# Patient Record
Sex: Male | Born: 1950
Health system: Southern US, Community
[De-identification: ages and names within clinical notes are randomized; demographics above are authoritative.]

## PROBLEM LIST (undated history)

## (undated) DIAGNOSIS — G4700 Insomnia, unspecified: Secondary | ICD-10-CM

## (undated) DIAGNOSIS — Z889 Allergy status to unspecified drugs, medicaments and biological substances status: Secondary | ICD-10-CM

## (undated) DIAGNOSIS — I452 Bifascicular block: Secondary | ICD-10-CM

## (undated) DIAGNOSIS — N529 Male erectile dysfunction, unspecified: Secondary | ICD-10-CM

## (undated) DIAGNOSIS — N400 Enlarged prostate without lower urinary tract symptoms: Secondary | ICD-10-CM

## (undated) HISTORY — PX: TONSILLECTOMY: SUR1361

## (undated) HISTORY — DX: Insomnia, unspecified: G47.00

## (undated) HISTORY — DX: Bifascicular block: I45.2

## (undated) HISTORY — DX: Benign prostatic hyperplasia without lower urinary tract symptoms: N40.0

## (undated) HISTORY — DX: Male erectile dysfunction, unspecified: N52.9

---

## 2001-11-24 ENCOUNTER — Encounter: Payer: Self-pay | Admitting: Urology

## 2001-11-24 ENCOUNTER — Ambulatory Visit (HOSPITAL_COMMUNITY): Admission: RE | Admit: 2001-11-24 | Discharge: 2001-11-24 | Payer: Self-pay | Admitting: Urology

## 2006-04-30 ENCOUNTER — Emergency Department (HOSPITAL_COMMUNITY): Admission: EM | Admit: 2006-04-30 | Discharge: 2006-04-30 | Payer: Self-pay | Admitting: Emergency Medicine

## 2006-05-01 ENCOUNTER — Emergency Department (HOSPITAL_COMMUNITY): Admission: EM | Admit: 2006-05-01 | Discharge: 2006-05-01 | Payer: Self-pay | Admitting: Emergency Medicine

## 2007-05-30 ENCOUNTER — Ambulatory Visit (HOSPITAL_COMMUNITY): Admission: RE | Admit: 2007-05-30 | Discharge: 2007-05-30 | Payer: Self-pay | Admitting: Gastroenterology

## 2009-07-28 ENCOUNTER — Encounter: Payer: Self-pay | Admitting: Cardiovascular Disease

## 2010-01-05 ENCOUNTER — Encounter: Payer: Self-pay | Admitting: Cardiovascular Disease

## 2010-01-07 ENCOUNTER — Ambulatory Visit (HOSPITAL_COMMUNITY): Admission: RE | Admit: 2010-01-07 | Discharge: 2010-01-07 | Payer: Self-pay | Admitting: Family Medicine

## 2010-01-07 ENCOUNTER — Encounter: Payer: Self-pay | Admitting: Cardiology

## 2010-01-07 ENCOUNTER — Ambulatory Visit: Payer: Self-pay

## 2010-01-07 ENCOUNTER — Encounter (INDEPENDENT_AMBULATORY_CARE_PROVIDER_SITE_OTHER): Payer: Self-pay | Admitting: Family Medicine

## 2010-01-07 ENCOUNTER — Ambulatory Visit: Payer: Self-pay | Admitting: Internal Medicine

## 2010-02-03 DIAGNOSIS — G4700 Insomnia, unspecified: Secondary | ICD-10-CM

## 2010-02-03 DIAGNOSIS — Z87448 Personal history of other diseases of urinary system: Secondary | ICD-10-CM

## 2010-02-03 DIAGNOSIS — I4510 Unspecified right bundle-branch block: Secondary | ICD-10-CM

## 2010-02-04 ENCOUNTER — Ambulatory Visit: Payer: Self-pay | Admitting: Cardiovascular Disease

## 2010-02-04 DIAGNOSIS — I428 Other cardiomyopathies: Secondary | ICD-10-CM | POA: Insufficient documentation

## 2010-02-04 DIAGNOSIS — R0602 Shortness of breath: Secondary | ICD-10-CM

## 2010-02-18 ENCOUNTER — Encounter (INDEPENDENT_AMBULATORY_CARE_PROVIDER_SITE_OTHER): Payer: Self-pay | Admitting: *Deleted

## 2010-02-23 ENCOUNTER — Ambulatory Visit (HOSPITAL_COMMUNITY)
Admission: RE | Admit: 2010-02-23 | Discharge: 2010-02-23 | Payer: Self-pay | Source: Home / Self Care | Attending: Cardiovascular Disease | Admitting: Cardiovascular Disease

## 2010-02-24 ENCOUNTER — Encounter: Payer: Self-pay | Admitting: Cardiovascular Disease

## 2010-04-07 NOTE — Assessment & Plan Note (Signed)
Summary: np3/abn echo   CC:  sob...follow up echo referal from dr. Paulino Rily.  History of Present Illness: Mariah is seen at the request of Dr Laurine Blazer.  He has a longstanding history of RBBB.  He indicates having a cath at age 60 or 14 for murmur with nothing bad found.  RBBB been intermitant since teenage years.  Had echo  01/07/10 which I reviewed the images with the patient and his wife who is a Teacher, early years/pre at [employer].  EF 45% with abnormal septal motion, inferobasal hypokinesis and mild MR.  Patient complians of some fatigue and exertional dyspnea but nothing too bad and certainly not worse than years ago.  No history of palpitatoins, syncope, SSCP, edema, PND or orthopnea.    Reviewed echo with paitent and discussed diagnosis of cardiomyopathy.  Need to R/O CAD although less likely given clinical history, chronic nature of RBBB.  I think best test would be cardiac CT rather than invasive cath.  Will need F/U EF evaluations with echo or MRI in 3-6 months.  Depending on CT results may start low dose ACE  Current Problems (verified): 1)  Cardiomyopathy  (ICD-425.4) 2)  Shortness of Breath  (ICD-786.05) 3)  Erectile Dysfunction, Organic, Hx of  (ICD-V13.09) 4)  Insomnia  (ICD-780.52) 5)  Rbbb  (ICD-426.4) 6)  Benign Prostatic Hypertrophy  (ICD-600.01)  Current Medications (verified): 1)  Flomax 0.4 Mg Caps (Tamsulosin Hcl) .Marland Kitchen.. 1 Tab By Mouth Once Daily 2)  Aspirin Ec 325 Mg Tbec (Aspirin) .... Take One Tablet By Mouth Daily 3)  Lunesta  (Eszopiclone) .... As Needed 4)  Saw Palmetto .... Daily  Allergies (verified): No Known Drug Allergies  Past History:  Family History: Last updated: 02/04/2010 noncontributory  Social History: Last updated: 02/04/2010 Married Sedentary Wife is a Teacher, early years/pre at [employer] non drinker  Family History: noncontributory  Social History: Married Sedentary Wife is a Teacher, early years/pre at [employer] non drinker  Review of Systems   Denies fever, malais, weight loss, blurry vision, decreased visual acuity, cough, sputum, hemoptysis, pleuritic pain, palpitaitons, heartburn, abdominal pain, melena, lower extremity edema, claudication, or rash.   Vital Signs:  Patient profile:   60 year old male Height:      72 inches Weight:      180 pounds BMI:     24.50 Pulse rate:   68 / minute Resp:     14 per minute BP sitting:   111 / 68  (left arm)  Vitals Entered By: Kem Parkinson (February 04, 2010 11:10 AM)  Physical Exam  General:  Affect appropriate Healthy:  appears stated age HEENT: normal Neck supple with no adenopathy JVP normal no bruits no thyromegaly Lungs clear with no wheezing and good diaphragmatic motion Heart:  S1/S2 no murmur,rub, gallop or click PMI normal Abdomen: benighn, BS positve, no tenderness, no AAA no bruit.  No HSM or HJR Distal pulses intact with no bruits No edema Neuro non-focal Skin warm and dry    Impression & Recommendations:  Problem # 1:  CARDIOMYOPATHY (ICD-425.4) Cardic CT R/O CAD  F/U EF evaluation with MRI in 3 months  Consider ACE pending CT results His updated medication list for this problem includes:    Aspirin Ec 325 Mg Tbec (Aspirin) .Marland Kitchen... Take one tablet by mouth daily  Orders: Cardiac CTA (Cardiac CTA)  Problem # 2:  RBBB (ICD-426.4) Chronic with no evidence of high grade heart block or syncope Likely benign His updated medication list for this problem includes:  Aspirin Ec 325 Mg Tbec (Aspirin) .Marland Kitchen... Take one tablet by mouth daily  Patient Instructions: 1)  Your physician recommends that you schedule a follow-up appointment in: 6 MONTHS AFTER CARDIAC CT  2)  Your physician recommends that you continue on your current medications as directed. Please refer to the Current Medication list given to you today. 3)  Your physician has requested that you have a cardiac CT.  Cardiac computed tomography (CT) is a painless test that uses an x-ray machine to take  clear, detailed pictures of your heart.  For further information please visit [email or web address]  Please follow instruction sheet as given.

## 2010-04-07 NOTE — Consult Note (Signed)
Summary: [facility]   Imported By: Earl Many 02/03/2010 16:06:42  _____________________________________________________________________  External Attachment:    Type:   Image     Comment:   External Document

## 2010-04-09 NOTE — Letter (Signed)
Summary: Generic Letter  Architectural technologist, Main Office  [address]   [place], [place] [address]   Phone: [phone]  Fax: [phone]        February 18, 2010 MRN: [id number]    Keith Jones [address] [place], [place]  [address]    Dear [name],  / You are scheduled for Cardiac CT on : 02/23/10 at 1pm.  DO NOT EAT OR DRINK ANYTHING AFTER 9AM, THE MORNING OF YOUR PROCEDURE.  PLEASE ARRIVE AT THE [place] OUT-PATIENT ADMISSION OFFICE LOCATED ON THE FIRST FLOOR NEAR THE GIFT SHOP, 1 HOUR PRIOR TO APPOINTMENT TIME.   Sincerely,   Merita Norton Lloyd-Fate

## 2010-04-09 NOTE — Miscellaneous (Signed)
  Clinical Lists Changes  Orders: Added new Referral order of Echocardiogram (Echo) - Signed 

## 2010-07-21 NOTE — Op Note (Signed)
NAME:  Keith Jones, Keith Jones NO.:  [id number]   MEDICAL RECORD NO.:  [id number]          PATIENT TYPE:  AMB   LOCATION:  ENDO                         FACILITY:  [facility]   PHYSICIAN:  Graylin Shiver, M.D.   DATE OF BIRTH:  March 19, 1950   DATE OF PROCEDURE:  05/30/2007  DATE OF DISCHARGE:                               OPERATIVE REPORT   PROCEDURE:  Colonoscopy.   INDICATIONS:  Screening.   PREMEDICATION:  Fentanyl 75 mcg IV, Versed 7.5 mg IV.   Informed consent was obtained after explanation of the risks of  bleeding, infection and perforation.   PROCEDURE:  With the patient in the left lateral decubitus position, a  rectal exam was performed.  No masses were felt.  The Pentax colonoscope  was inserted into the rectum and advanced around the colon to the cecum.  Cecal landmarks were identified.  The cecum and ascending colon were  normal, the transverse colon normal, the descending colon, sigmoid and  rectum were normal.  Retroflexion was normal.  He tolerated the  procedure well without complications.   IMPRESSION:  Normal colonoscopy to the cecum.   I would recommend a follow-up screening colonoscopy again in 10 years.           ______________________________  Graylin Shiver, M.D.     SFG/MEDQ  D:  05/30/2007  T:  05/30/2007  Job:  [id number]   cc:   Oley Balm. Georgina Pillion, M.D.

## 2010-08-18 ENCOUNTER — Encounter: Payer: Self-pay | Admitting: Cardiovascular Disease

## 2010-08-19 ENCOUNTER — Encounter: Payer: Self-pay | Admitting: Cardiovascular Disease

## 2010-08-19 ENCOUNTER — Ambulatory Visit (INDEPENDENT_AMBULATORY_CARE_PROVIDER_SITE_OTHER): Payer: 59 | Admitting: Cardiovascular Disease

## 2010-08-19 DIAGNOSIS — I4510 Unspecified right bundle-branch block: Secondary | ICD-10-CM

## 2010-08-19 DIAGNOSIS — I428 Other cardiomyopathies: Secondary | ICD-10-CM

## 2010-08-19 NOTE — Assessment & Plan Note (Signed)
F/U MRI for EF no iv or contrast needed  If EF less than 45% will start ACE

## 2010-08-19 NOTE — Assessment & Plan Note (Signed)
Benign not indicative of DCM  Yearly ECG unless new symptoms

## 2010-08-19 NOTE — Patient Instructions (Signed)
Your physician has requested that you have a cardiac MRI. Cardiac MRI uses a computer to create images of your heart as its beating, producing both still and moving pictures of your heart and major blood vessels. For further information please visit [email or web address]. Please follow the instruction sheet given to you today for more information.NO IV FOR TESTING  Your physician wants you to follow-up in: 6 MONTHS You will receive a reminder letter in the mail two months in advance. If you don't receive a letter, please call our office to schedule the follow-up appointment.

## 2010-08-19 NOTE — Progress Notes (Signed)
Keith Jones is seen at the request of Dr Laurine Blazer. He has a longstanding history of RBBB. He indicates having a cath at age 60 or 5 for murmur with nothing bad found. RBBB been intermitant since teenage years. Had echo 01/07/10 which I reviewed the images with the patient and his wife who is a Teacher, early years/pre at [employer]. EF 45% with abnormal septal motion, inferobasal hypokinesis and mild MR. Patient complians of some fatigue and exertional dyspnea but nothing too bad and certainly not worse than years ago. No history of palpitatoins, syncope, SSCP, edema, PND or orthopnea.  Reviewed echo with paitent and discussed diagnosis of cardiomyopathy.   Cardiac CT 12/11 with calcium score 150 and no critical CAD.  Prospective scan so no EF done Will have F/U MRI now to see if EF stable.  No iv or contrast needed to quantitate EF Asked him to monitor BP at home.  He does not want to be on lifelong ACE  ROS: Denies fever, malais, weight loss, blurry vision, decreased visual acuity, cough, sputum, SOB, hemoptysis, pleuritic pain, palpitaitons, heartburn, abdominal pain, melena, lower extremity edema, claudication, or rash.  All other systems reviewed and negative  General: Affect appropriate Healthy:  appears stated age HEENT: normal Neck supple with no adenopathy JVP normal no bruits no thyromegaly Lungs clear with no wheezing and good diaphragmatic motion Heart:  S1/S2 no murmur,rub, gallop or click PMI normal Abdomen: benighn, BS positve, no tenderness, no AAA no bruit.  No HSM or HJR Distal pulses intact with no bruits No edema Neuro non-focal Skin warm and dry No muscular weakness   Current Outpatient Prescriptions  Medication Sig Dispense Refill  . aspirin 325 MG tablet Take 325 mg by mouth daily.        . eszopiclone (LUNESTA) 1 MG TABS Take 1 mg by mouth as needed. Take immediately before bedtime       . saw palmetto 160 MG capsule Take 160 mg by mouth 2 (two) times daily.        . Tamsulosin HCl  (FLOMAX) 0.4 MG CAPS Take 0.4 mg by mouth as needed.         Allergies  Review of patient's allergies indicates not on file.  Electrocardiogram:  Assessment and Plan

## 2010-08-25 ENCOUNTER — Inpatient Hospital Stay (HOSPITAL_COMMUNITY): Admission: RE | Admit: 2010-08-25 | Payer: 59 | Source: Ambulatory Visit

## 2010-08-26 ENCOUNTER — Encounter: Payer: Self-pay | Admitting: Cardiovascular Disease

## 2010-09-08 ENCOUNTER — Ambulatory Visit (HOSPITAL_COMMUNITY)
Admission: RE | Admit: 2010-09-08 | Discharge: 2010-09-08 | Disposition: A | Discharge status: Home / Self Care | Payer: 59 | Source: Ambulatory Visit | Attending: Cardiovascular Disease | Admitting: Cardiovascular Disease

## 2010-09-08 DIAGNOSIS — I428 Other cardiomyopathies: Secondary | ICD-10-CM | POA: Insufficient documentation

## 2010-09-08 DIAGNOSIS — I4510 Unspecified right bundle-branch block: Secondary | ICD-10-CM | POA: Insufficient documentation

## 2012-09-05 ENCOUNTER — Other Ambulatory Visit: Payer: Self-pay | Admitting: Family Medicine

## 2012-09-05 DIAGNOSIS — R1900 Intra-abdominal and pelvic swelling, mass and lump, unspecified site: Secondary | ICD-10-CM

## 2012-09-07 ENCOUNTER — Other Ambulatory Visit: Payer: 59

## 2012-11-09 ENCOUNTER — Other Ambulatory Visit: Payer: Self-pay | Admitting: Urology

## 2012-11-13 ENCOUNTER — Encounter (HOSPITAL_COMMUNITY): Payer: Self-pay | Admitting: Pharmacy Technician

## 2012-11-16 ENCOUNTER — Encounter (HOSPITAL_COMMUNITY): Payer: Self-pay

## 2012-11-16 ENCOUNTER — Other Ambulatory Visit: Payer: Self-pay

## 2012-11-16 ENCOUNTER — Encounter (HOSPITAL_COMMUNITY)
Admission: RE | Admit: 2012-11-16 | Discharge: 2012-11-16 | Disposition: A | Discharge status: Home / Self Care | Payer: 59 | Source: Ambulatory Visit | Attending: Urology | Admitting: Urology

## 2012-11-16 DIAGNOSIS — Z01812 Encounter for preprocedural laboratory examination: Secondary | ICD-10-CM | POA: Insufficient documentation

## 2012-11-16 DIAGNOSIS — N400 Enlarged prostate without lower urinary tract symptoms: Secondary | ICD-10-CM | POA: Insufficient documentation

## 2012-11-16 DIAGNOSIS — Z889 Allergy status to unspecified drugs, medicaments and biological substances status: Secondary | ICD-10-CM

## 2012-11-16 DIAGNOSIS — Z01810 Encounter for preprocedural cardiovascular examination: Secondary | ICD-10-CM | POA: Insufficient documentation

## 2012-11-16 HISTORY — DX: Allergy status to unspecified drugs, medicaments and biological substances: Z88.9

## 2012-11-16 HISTORY — DX: Allergy status to unspecified drugs, medicaments and biological substances status: Z88.9

## 2012-11-16 LAB — CBC
MCH: 29.1 pg (ref 26.0–34.0)
MCHC: 33.8 g/dL (ref 30.0–36.0)
Platelets: 221 10*3/uL (ref 150–400)
RDW: 13.3 % (ref 11.5–15.5)

## 2012-11-16 LAB — BASIC METABOLIC PANEL
GFR calc Af Amer: 75 mL/min — ABNORMAL LOW (ref >90)
GFR calc non Af Amer: 65 mL/min — ABNORMAL LOW (ref >90)
Glucose, Bld: 97 mg/dL (ref 70–99)
Potassium: 4.7 mEq/L (ref 3.5–5.1)
Sodium: 138 mEq/L (ref 135–145)

## 2012-11-16 NOTE — Pre-Procedure Instructions (Signed)
11-16-12 EKG done today.

## 2012-11-16 NOTE — Patient Instructions (Addendum)
20 Keith Jones  11/16/2012   Your procedure is scheduled on:  9-19 -2014  Report to [facility] at    0700  AM.  Call this number if you have problems the morning of surgery: [phone]  Or Presurgical Testing [phone]([name])   Remember: Follow any bowel prep instructions per MD office.    Do not eat food:After Midnight.      Take these medicines the morning of surgery with A SIP OF WATER: none   Do not wear jewelry, make-up or nail polish.  Do not wear lotions, powders, or perfumes. You may wear deodorant.  Do not shave 12 hours prior to first CHG shower(legs and under arms).(face and neck okay.)  Do not bring valuables to the hospital.  Contacts, dentures or bridgework,body piercing,  may not be worn into surgery.  Leave suitcase in the car. After surgery it may be brought to your room.  For patients admitted to the hospital, checkout time is 11:00 AM the day of discharge.   Patients discharged the day of surgery will not be allowed to drive home. Must have responsible person with you x 24 hours once discharged.  Name and phone number of your driver: [name]-spouse [phone] cell  Special Instructions: CHG(Chlorhedine 4%-"Hibiclens","Betasept","Aplicare") Shower Use Special Wash: see special instructions.(avoid face and genitals)      Failure to follow these instructions may result in Cancellation of your surgery.   Patient signature_______________________________________________________

## 2012-11-24 ENCOUNTER — Encounter (HOSPITAL_COMMUNITY): Payer: Self-pay | Admitting: *Deleted

## 2012-11-24 ENCOUNTER — Encounter (HOSPITAL_COMMUNITY): Payer: Self-pay | Admitting: Anesthesiology

## 2012-11-24 ENCOUNTER — Inpatient Hospital Stay (HOSPITAL_COMMUNITY)
Admission: RE | Admit: 2012-11-24 | Discharge: 2012-11-25 | DRG: 713 | Disposition: A | Discharge status: Home / Self Care | Payer: 59 | Source: Ambulatory Visit | Attending: Urology | Admitting: Urology

## 2012-11-24 ENCOUNTER — Encounter (HOSPITAL_COMMUNITY)
Admission: RE | Disposition: A | Discharge status: Home / Self Care | Payer: Self-pay | Source: Ambulatory Visit | Attending: Urology

## 2012-11-24 ENCOUNTER — Ambulatory Visit (HOSPITAL_COMMUNITY): Payer: 59 | Admitting: Anesthesiology

## 2012-11-24 DIAGNOSIS — I4892 Unspecified atrial flutter: Secondary | ICD-10-CM

## 2012-11-24 DIAGNOSIS — I452 Bifascicular block: Secondary | ICD-10-CM | POA: Diagnosis present

## 2012-11-24 DIAGNOSIS — Z01812 Encounter for preprocedural laboratory examination: Secondary | ICD-10-CM

## 2012-11-24 DIAGNOSIS — F3289 Other specified depressive episodes: Secondary | ICD-10-CM | POA: Diagnosis present

## 2012-11-24 DIAGNOSIS — N320 Bladder-neck obstruction: Secondary | ICD-10-CM | POA: Diagnosis present

## 2012-11-24 DIAGNOSIS — F329 Major depressive disorder, single episode, unspecified: Secondary | ICD-10-CM | POA: Diagnosis present

## 2012-11-24 DIAGNOSIS — I498 Other specified cardiac arrhythmias: Secondary | ICD-10-CM | POA: Diagnosis present

## 2012-11-24 DIAGNOSIS — N138 Other obstructive and reflux uropathy: Principal | ICD-10-CM | POA: Diagnosis present

## 2012-11-24 DIAGNOSIS — F411 Generalized anxiety disorder: Secondary | ICD-10-CM | POA: Diagnosis present

## 2012-11-24 DIAGNOSIS — G4700 Insomnia, unspecified: Secondary | ICD-10-CM | POA: Diagnosis present

## 2012-11-24 DIAGNOSIS — N401 Enlarged prostate with lower urinary tract symptoms: Principal | ICD-10-CM | POA: Diagnosis present

## 2012-11-24 DIAGNOSIS — Z8042 Family history of malignant neoplasm of prostate: Secondary | ICD-10-CM

## 2012-11-24 DIAGNOSIS — Z01810 Encounter for preprocedural cardiovascular examination: Secondary | ICD-10-CM

## 2012-11-24 DIAGNOSIS — N529 Male erectile dysfunction, unspecified: Secondary | ICD-10-CM | POA: Diagnosis present

## 2012-11-24 DIAGNOSIS — Z79899 Other long term (current) drug therapy: Secondary | ICD-10-CM

## 2012-11-24 DIAGNOSIS — I2510 Atherosclerotic heart disease of native coronary artery without angina pectoris: Secondary | ICD-10-CM | POA: Diagnosis present

## 2012-11-24 DIAGNOSIS — Z7982 Long term (current) use of aspirin: Secondary | ICD-10-CM

## 2012-11-24 DIAGNOSIS — R339 Retention of urine, unspecified: Secondary | ICD-10-CM | POA: Diagnosis present

## 2012-11-24 HISTORY — PX: TRANSURETHRAL RESECTION OF PROSTATE: SHX73

## 2012-11-24 HISTORY — PX: CYSTOSCOPY: SHX5120

## 2012-11-24 LAB — BASIC METABOLIC PANEL
BUN: 17 mg/dL (ref 6–23)
CO2: 27 mEq/L (ref 19–32)
Calcium: 9 mg/dL (ref 8.4–10.5)
Chloride: 105 mEq/L (ref 96–112)
Creatinine, Ser: 1.04 mg/dL (ref 0.50–1.35)
Glucose, Bld: 97 mg/dL (ref 70–99)

## 2012-11-24 LAB — HEMOGLOBIN AND HEMATOCRIT, BLOOD: Hemoglobin: 12.6 g/dL — ABNORMAL LOW (ref 13.0–17.0)

## 2012-11-24 SURGERY — TRANSURETHRAL RESECTION OF THE PROSTATE WITH GYRUS INSTRUMENTS
Anesthesia: General | Site: Prostate | Wound class: Clean Contaminated

## 2012-11-24 MED ORDER — ZOLPIDEM TARTRATE 5 MG PO TABS: 5.0000 mg | ORAL_TABLET | Freq: Every evening | ORAL | Status: DC | PRN

## 2012-11-24 MED ORDER — ONDANSETRON HCL 4 MG/2ML IJ SOLN: 4.0000 mg | INTRAMUSCULAR | Status: DC | PRN

## 2012-11-24 MED ORDER — PROMETHAZINE HCL 25 MG/ML IJ SOLN: 6.2500 mg | INTRAMUSCULAR | Status: DC | PRN

## 2012-11-24 MED ORDER — HYDROMORPHONE HCL PF 1 MG/ML IJ SOLN: 0.5000 mg | INTRAMUSCULAR | Status: DC | PRN

## 2012-11-24 MED ORDER — SODIUM CHLORIDE 0.9 % IR SOLN
Status: DC | PRN
  Administered 2012-11-24: 9000 mL

## 2012-11-24 MED ORDER — DIPHENHYDRAMINE HCL 12.5 MG/5ML PO ELIX: 12.5000 mg | ORAL_SOLUTION | Freq: Four times a day (QID) | ORAL | Status: DC | PRN

## 2012-11-24 MED ORDER — HYDROMORPHONE HCL PF 1 MG/ML IJ SOLN
0.2500 mg | INTRAMUSCULAR | Status: DC | PRN
  Administered 2012-11-24 (×2): 0.5 mg via INTRAVENOUS

## 2012-11-24 MED ORDER — DEXAMETHASONE SODIUM PHOSPHATE 10 MG/ML IJ SOLN
INTRAMUSCULAR | Status: DC | PRN
  Administered 2012-11-24: 10 mg via INTRAVENOUS

## 2012-11-24 MED ORDER — STERILE WATER FOR IRRIGATION IR SOLN
Status: DC | PRN
  Administered 2012-11-24: 1

## 2012-11-24 MED ORDER — BELLADONNA ALKALOIDS-OPIUM 16.2-60 MG RE SUPP
RECTAL | Status: DC | PRN
  Administered 2012-11-24: 1 via RECTAL

## 2012-11-24 MED ORDER — LACTATED RINGERS IV SOLN
INTRAVENOUS | Status: DC
  Administered 2012-11-24: 1000 mL via INTRAVENOUS

## 2012-11-24 MED ORDER — BISACODYL 10 MG RE SUPP: 10.0000 mg | Freq: Every day | RECTAL | Status: DC | PRN

## 2012-11-24 MED ORDER — CEFAZOLIN SODIUM-DEXTROSE 2-3 GM-% IV SOLR
2.0000 g | INTRAVENOUS | Status: AC
  Administered 2012-11-24: 2 g via INTRAVENOUS

## 2012-11-24 MED ORDER — FENTANYL CITRATE 0.05 MG/ML IJ SOLN
INTRAMUSCULAR | Status: DC | PRN
  Administered 2012-11-24 (×2): 50 ug via INTRAVENOUS

## 2012-11-24 MED ORDER — OXYCODONE-ACETAMINOPHEN 5-325 MG PO TABS
1.0000 | ORAL_TABLET | ORAL | Status: DC | PRN
  Administered 2012-11-24 (×2): 1 via ORAL
  Filled 2012-11-24 (×3): qty 1

## 2012-11-24 MED ORDER — MIDAZOLAM HCL 5 MG/5ML IJ SOLN
INTRAMUSCULAR | Status: DC | PRN
  Administered 2012-11-24: 1 mg via INTRAVENOUS

## 2012-11-24 MED ORDER — LACTATED RINGERS IV SOLN: INTRAVENOUS | Status: DC

## 2012-11-24 MED ORDER — DIPHENHYDRAMINE HCL 50 MG/ML IJ SOLN: 12.5000 mg | Freq: Four times a day (QID) | INTRAMUSCULAR | Status: DC | PRN

## 2012-11-24 MED ORDER — BELLADONNA ALKALOIDS-OPIUM 16.2-60 MG RE SUPP
RECTAL | Status: AC
  Filled 2012-11-24: qty 1

## 2012-11-24 MED ORDER — ONDANSETRON HCL 4 MG/2ML IJ SOLN
INTRAMUSCULAR | Status: DC | PRN
  Administered 2012-11-24: 4 mg via INTRAVENOUS

## 2012-11-24 MED ORDER — BACITRACIN-NEOMYCIN-POLYMYXIN 400-5-5000 EX OINT: 1.0000 "application " | TOPICAL_OINTMENT | Freq: Three times a day (TID) | CUTANEOUS | Status: DC | PRN

## 2012-11-24 MED ORDER — HYDROMORPHONE HCL PF 1 MG/ML IJ SOLN
INTRAMUSCULAR | Status: AC
  Filled 2012-11-24: qty 1

## 2012-11-24 MED ORDER — CEFAZOLIN SODIUM-DEXTROSE 2-3 GM-% IV SOLR
INTRAVENOUS | Status: AC
  Filled 2012-11-24: qty 50

## 2012-11-24 MED ORDER — PROPOFOL 10 MG/ML IV BOLUS
INTRAVENOUS | Status: DC | PRN
  Administered 2012-11-24: 180 mg via INTRAVENOUS

## 2012-11-24 MED ORDER — OXYBUTYNIN CHLORIDE 5 MG PO TABS
5.0000 mg | ORAL_TABLET | Freq: Three times a day (TID) | ORAL | Status: DC | PRN
  Filled 2012-11-24: qty 1

## 2012-11-24 MED ORDER — CIPROFLOXACIN HCL 500 MG PO TABS
500.0000 mg | ORAL_TABLET | Freq: Two times a day (BID) | ORAL | Status: DC
  Administered 2012-11-24 – 2012-11-25 (×3): 500 mg via ORAL
  Filled 2012-11-24 (×4): qty 1

## 2012-11-24 MED ORDER — SODIUM CHLORIDE 0.45 % IV SOLN
INTRAVENOUS | Status: DC
  Administered 2012-11-24: 17:00:00 via INTRAVENOUS

## 2012-11-24 SURGICAL SUPPLY — 34 items
BAG URINE DRAINAGE (UROLOGICAL SUPPLIES) IMPLANT
BAG URO CATCHER STRL LF (DRAPE) ×3 IMPLANT
BLADE SURG 15 STRL LF DISP TIS (BLADE) IMPLANT
BLADE SURG 15 STRL SS (BLADE)
CATH AINSWORTH 30CC 24FR (CATHETERS) IMPLANT
CATH FOLEY 3WAY 30CC 24FR (CATHETERS)
CATH HEMA 3WAY 30CC 22FR COUDE (CATHETERS) ×3 IMPLANT
CATH ROBINSON RED A/P 16FR (CATHETERS) IMPLANT
CATH URO 16X24FR 3W FL PS (CATHETERS) IMPLANT
CLOTH BEACON ORANGE TIMEOUT ST (SAFETY) ×3 IMPLANT
DRAPE CAMERA CLOSED 9X96 (DRAPES) ×3 IMPLANT
ELECT REM PT RETURN 9FT ADLT (ELECTROSURGICAL)
ELECTRODE REM PT RTRN 9FT ADLT (ELECTROSURGICAL) IMPLANT
GLOVE BIOGEL M STRL SZ7.5 (GLOVE) ×3 IMPLANT
GLOVE BIOGEL PI IND STRL 7.0 (GLOVE) ×4 IMPLANT
GLOVE BIOGEL PI INDICATOR 7.0 (GLOVE) ×2
GOWN PREVENTION PLUS LG XLONG (DISPOSABLE) ×3 IMPLANT
GOWN STRL REIN XL XLG (GOWN DISPOSABLE) ×3 IMPLANT
HOLDER FOLEY CATH W/STRAP (MISCELLANEOUS) IMPLANT
IV NS IRRIG 3000ML ARTHROMATIC (IV SOLUTION) ×6 IMPLANT
KIT ASPIRATION TUBING (SET/KITS/TRAYS/PACK) ×3 IMPLANT
KIT SUPRAPUBIC CATH (MISCELLANEOUS) IMPLANT
LOOPS RESECTOSCOPE DISP (ELECTROSURGICAL) IMPLANT
MANIFOLD NEPTUNE II (INSTRUMENTS) ×3 IMPLANT
MARKER SKIN DUAL TIP RULER LAB (MISCELLANEOUS) ×3 IMPLANT
NEEDLE SPNL 18GX3.5 QUINCKE PK (NEEDLE) IMPLANT
NS IRRIG 1000ML POUR BTL (IV SOLUTION) ×3 IMPLANT
PACK CYSTO (CUSTOM PROCEDURE TRAY) ×3 IMPLANT
SCRUB PCMX 4 OZ (MISCELLANEOUS) IMPLANT
SUT ETHILON 3 0 PS 1 (SUTURE) IMPLANT
SYR 30ML LL (SYRINGE) ×3 IMPLANT
SYRINGE IRR TOOMEY STRL 70CC (SYRINGE) ×3 IMPLANT
TUBING CONNECTING 10 (TUBING) ×3 IMPLANT
WATER STERILE IRR 500ML POUR (IV SOLUTION) ×3 IMPLANT

## 2012-11-24 NOTE — H&P (Signed)
cc:  Dr. Docia Chuck   Reason For Visit  Cystoscopy, flowrate, PVR and PUS   Active Problems Problems  1. Benign Prostatic Hypertrophy With Urinary Obstruction 600.01 2. Incomplete Emptying Of Bladder 788.21 3. Paternal history of  Prostate Cancer V16.42 4. PSA,Elevated 790.93  History of Present Illness         62 yo male returns today for cystoscopy, flowrate, PVR and PUS.  Hx of BPH and elevated PSA.  He continues to have issues with incontinence at night time (wears a towel) & also happens when he takes a nap. Diet: coffee - 3-4 cups/day. Nightime: minimal caffeine. IPSS=19.      He was orginally referred by Dr. Georgina Pillion for evaluation of benign prostatic hyperplasia.  He has been treated currently with Flomax by Dr. Georgina Pillion for bladder outlet symptoms.  However, the patient still has a bladder symptom score sheet of 11/7 with nocturia x3, and urinary frequency, difficulty postponing urination, and a weakened urinary stream, Rx Saw Palmetto.  Note, the patient's father developed carcinoma of the prostate 11-01-63, and died secondary to carcinoma of the prostate at age 53.  The patient had PSA of 2.7, which has risen to 5.1, as noted by Dr. Georgina Pillion.  Note that the patient has increased coffee intake, and this may be related to his urinary frequency.  He has no gross hematuria. Prostate 47.69cc in size. PBx negative for Calcifications. BPH only, and slight inflammation. PSA 4.35/11.3%. Comparison of PSA shows PSA 2009=4.97, PSA, 2010=4.35, and PSA June, 2011=4.0.  08/21/12  PSA - 3.48 (Dr. Docia Chuck) 10/06/11  PSA - 3.30 08/26/10  PSA - 2.69/15%   Past Medical History Problems  1. History of  Anxiety (Symptom) 300.00 2. History of  Depression 311 3. History of  Sinus Arrhythmia 427.9  Surgical History Problems  1. History of  Dental Surgery 2. History of  Tonsillectomy  Current Meds 1. Aspirin 81 MG Oral Tablet; Therapy: (Recorded:12Aug2014) to 2. Cats Claw CAPS; Therapy:  (Recorded:12Aug2014) to 3. CoQ10 CAPS; Therapy: (Recorded:12Aug2014) to 4. Lunesta 3 MG Oral Tablet; Therapy: (Recorded:27Jan2009) to 5. Multi-Vitamin TABS; Therapy: (Recorded:27Jan2009) to 6. Saw Palmetto 450 MG Oral Capsule; Therapy: (Recorded:27Jan2009) to 7. Vitamin C TABS; Therapy: (Recorded:12Aug2014) to 8. Vitamin D CAPS; Therapy: (Recorded:12Aug2014) to  Allergies Medication  1. No Known Drug Allergies  Family History Problems  1. Maternal history of  Arthritis V17.7 2. Maternal history of  Brain Cancer V16.8 3. Paternal history of  Epilepsy And Recurrent Seizures V17.2 4. Family history of  Family Health Status - Father's Age age 47 from prostate cancer 5. Family history of  Family Health Status - Mother's Age age 94 died from brain cancer 6. Paternal history of  Hematuria 7. Paternal history of  Hypertension V17.49 8. Paternal history of  Prostate Cancer V16.42 9. Paternal history of  Renal Failure  Social History Problems  1. Alcohol Use 1 glass of wine a day 2. Caffeine Use 6 cups a day 3. Marital History - Currently Married 4. Never A Smoker 5. Occupation: retired Denied  6. History of  Former Smoker 7. History of  Tobacco Use V15.82  Review of Systems Genitourinary, constitutional, skin, eye, otolaryngeal, hematologic/lymphatic, cardiovascular, pulmonary, endocrine, musculoskeletal, gastrointestinal, neurological and psychiatric system(s) were reviewed and pertinent findings if present are noted.  Genitourinary: urinary frequency, feelings of urinary urgency, nocturia, incontinence, weak urinary stream, urinary stream starts and stops and incomplete emptying of bladder.  Gastrointestinal: abdominal distention.    Vitals Vital Signs [Data Includes:  Last 1 Day]  22Aug2014 12:28PM  Blood Pressure: 142 / 82 Temperature: 98.6 F Heart Rate: 69  Results/Data  Urine [Data Includes: Last 1 Day]   22Aug2014  COLOR YELLOW   APPEARANCE CLEAR   SPECIFIC  GRAVITY 1.010   pH 5.5   GLUCOSE NEG mg/dL  BILIRUBIN NEG   KETONE NEG mg/dL  BLOOD NEG   PROTEIN NEG mg/dL  UROBILINOGEN 0.2 mg/dL  NITRITE NEG   LEUKOCYTE ESTERASE NEG    Flow Rate: Voided 137 ml. A peak flow rate of 59ml/s and mean flow rate of 9ml/s.  PVR: Ultrasound PVR > 881.02 ml. Cathed PVR 1500 ml. Selected Results  CREATININE with eGFR 12Aug2014 10:14AM Jethro Bolus  SPECIMEN TYPE: BLOOD   Test Name Result Flag Reference  CREATININE 1.94 mg/dL H 1.61-0.96  Est GFR, African American 42 mL/min L   Est GFR, NonAfrican American 36 mL/min L   THE ESTIMATED GFR IS A CALCULATION VALID FOR ADULTS (>=5 YEARS OLD) THAT USES THE CKD-EPI ALGORITHM TO ADJUST FOR AGE AND SEX. IT IS   NOT TO BE USED FOR CHILDREN, PREGNANT WOMEN, HOSPITALIZED PATIENTS,    PATIENTS ON DIALYSIS, OR WITH RAPIDLY CHANGING KIDNEY FUNCTION. ACCORDING TO THE NKDEP, EGFR >89 IS NORMAL, 60-89 SHOWS MILD IMPAIRMENT, 30-59 SHOWS MODERATE IMPAIRMENT, 15-29 SHOWS SEVERE IMPAIRMENT AND <15 IS ESRD.    27 Oct 2012 12:22 PM   UA With REFLEX       COLOR YELLOW       APPEARANCE CLEAR       SPECIFIC GRAVITY 1.010       pH 5.5       GLUCOSE NEG       BILIRUBIN NEG       KETONE NEG       BLOOD NEG       PROTEIN NEG       UROBILINOGEN 0.2       NITRITE NEG       LEUKOCYTE ESTERASE NEG   Procedure     Prostate u/s today:  Length - 5.26, Height - 4.01cm and Width - 5.11cm.  Total volume - 56.44 grams.  Prostate was homogenous without lesions.  No median lobe noted.  Bilateral hydronephrosis & bilateral hydroproximal ureter was noted.  Renal u/s today:  Rt kidney:  Length - 11.93cm, Cortical width - 0.68cm.  Moderate hydronephrosis with proximal ureteral hydro.   Lt kidney:  Length - 12.27cm, Cortical width - 0.85cm.  Moderate hyrdonephrosis with proximal to mid ureteral hydro.   16 fr foley inserted w/o difficulty & drained 1500cc of clear urine.   Procedure: Cystoscopy  Chaperone Present: kim lewis.   Indication: Lower Urinary Tract Symptoms.  Informed Consent: Risks, benefits, and potential adverse events were discussed and informed consent was obtained from the patient.  Prep: The patient was prepped with betadine.  Anesthesia:. Local anesthesia was administered intraurethrally with 2% lidocaine jelly.  Antibiotic prophylaxis: Ciprofloxacin.  Procedure Note:  Urethral meatus:. No abnormalities.  Anterior urethra: No abnormalities.  Prostatic urethra:. There was visual obstruction of the prostatic urethra. The lateral and median prostatic lobes were enlarged. An enlarged intravesical median lobe was visualized. Greatly enalrged LL lobe and median lobe.  Bladder: Visulization was clear. The ureteral orifices were in the normal anatomic position bilaterally. The mucosa was smooth without abnormalities. Examination of the bladder demonstrated trabeculation, but no clot within the bladder and no diverticulum no erythematous mucosa, no ulcer, no edema and no cellules. The patient tolerated the procedure  well.  Complications: None.    Assessment Assessed  1. Benign Prostatic Hypertrophy With Urinary Obstruction 600.01 2. Incomplete Emptying Of Bladder 788.21 3. Paternal history of  Prostate Cancer V16.42 4. PSA,Elevated 790.93   62 yo male with IPSS=19, and nighttime incontinence, despite Flomax therapy.  The patient returns today following evaluation of abdominal mass, findings of 881 cc postvoid residual, with 56.44 cc prostate volume, 2 cc/s flow rate peak, with a renal ultrasound showing bilateral Hydro ureterohydronephrosis.  I have discussed the findings with the patient, and in concerned because the patient does not associate his symptoms with his physical findings.  At age 65, he is going to need transurethral prostatic resection of the greatly enlarged left lobe of the prostate, as well as his median lobe.  He understands this will change his ejaculatory process and leave him with  retrograde ejaculation.  However, he also understands that the most important concern to me is protection of his upper urinary tracts.  Of note is a increase in  his creatinine value to 1.96, with a decrease in his GFR to 36 cc/m.  I hope that with Foley catheterization today, that we can improve his renal filtration.  He will be scheduled for repeat creatinine/GFR, and TURP.  He has Foley catheter insertion today, with 800 cc postvoid residual.   Plan  Incomplete Emptying Of Bladder (788.21)  1. RENAL U/S COMPLETE  Done: 22Aug2014 12:00AM    Cath today, adn re-ck Cr/gft in 1 week. Schedule TURP. Will eventually need u/s to see if hyrronephrosis goes away.   UA With REFLEX  Status: Resulted - Requires Verification  Done: 01Jan0001 12:00AM Ordered Today; For: Health Maintenance (V70.0); Ordered By: Jethro Bolus  Due: 24Aug2014 Marked Important; Last Updated By: Junius Argyle Electronically signed by : Jethro Bolus, M.D.; Oct 27 2012  4:47PM

## 2012-11-24 NOTE — Interval H&P Note (Signed)
History and Physical Interval Note:  11/24/2012 9:14 AM  Keith Jones  has presented today for surgery, with the diagnosis of BPH (benign prostatic hypertrophy)  The various methods of treatment have been discussed with the patient and family. After consideration of risks, benefits and other options for treatment, the patient has consented to  Procedure(s): TRANSURETHRAL RESECTION OF THE PROSTATE WITH GYRUS INSTRUMENTS (N/A) CYSTOSCOPY (N/A) as a surgical intervention .  The patient's history has been reviewed, patient examined, no change in status, stable for surgery.  I have reviewed the patient's chart and labs.  Questions were answered to the patient's satisfaction.     Jethro Bolus I

## 2012-11-24 NOTE — Anesthesia Preprocedure Evaluation (Signed)
Anesthesia Evaluation  Patient identified by MRN, date of birth, ID band Patient awake    Reviewed: Allergy & Precautions, H&P , NPO status , Patient's Chart, lab work & pertinent test results  Airway Mallampati: II TM Distance: >3 FB Neck ROM: Full    Dental  (+) Teeth Intact and Dental Advisory Given   Pulmonary shortness of breath,  breath sounds clear to auscultation  Pulmonary exam normal       Cardiovascular negative cardio ROS  + dysrhythmias  Cardiomyopathy, history of bundle branch block.   Neuro/Psych negative neurological ROS  negative psych ROS   GI/Hepatic negative GI ROS, Neg liver ROS,   Endo/Other  negative endocrine ROS  Renal/GU negative Renal ROS  negative genitourinary   Musculoskeletal negative musculoskeletal ROS (+)   Abdominal   Peds negative pediatric ROS (+)  Hematology negative hematology ROS (+)   Anesthesia Other Findings   Reproductive/Obstetrics                           Anesthesia Physical Anesthesia Plan  ASA: II  Anesthesia Plan: General   Post-op Pain Management:    Induction: Intravenous  Airway Management Planned: LMA  Additional Equipment:   Intra-op Plan:   Post-operative Plan: Extubation in OR  Informed Consent: I have reviewed the patients History and Physical, chart, labs and discussed the procedure including the risks, benefits and alternatives for the proposed anesthesia with the patient or authorized representative who has indicated his/her understanding and acceptance.   Dental advisory given  Plan Discussed with: CRNA  Anesthesia Plan Comments:         Anesthesia Quick Evaluation

## 2012-11-24 NOTE — Preoperative (Signed)
Beta Blockers   Reason not to administer Beta Blockers:Not Applicable 

## 2012-11-24 NOTE — Op Note (Signed)
Pre-operative diagnosis :   Benign prostate hyperplasia with obstruction  Postoperative diagnosis:  Same  Operation:  Cystourethroscopy, TURP  Surgeon:  S. Patsi Sears, MD  First assistant:  None  Anesthesia:  General LMA  Preparation:  After appropriate preanesthesia, the patient was brought to the operating room, placed on the operating table in the dorsal supine position where general LMA anesthesia was introduced. He was then replaced in dorsal lithotomy position where the pubis was prepped with Betadine solution and draped in usual fashion. Armband was double checked. History was double checked.  Review history:  Benign Prostatic Hypertrophy With Urinary Obstruction 600.01  2. Incomplete Emptying Of Bladder 788.21  3. Paternal history of Prostate Cancer V16.42  4. PSA,Elevated 790.93  History of Present Illness  62 yo male returns today for cystoscopy, flowrate, PVR and PUS. Hx of BPH and elevated PSA. He continues to have issues with incontinence at night time (wears a towel) & also happens when he takes a nap. Diet: coffee - 3-4 cups/day. Nightime: minimal caffeine. IPSS=19.  He was orginally referred by Dr. Georgina Pillion for evaluation of benign prostatic hyperplasia. He has been treated currently with Flomax by Dr. Georgina Pillion for bladder outlet symptoms. However, the patient still has a bladder symptom score sheet of 11/7 with nocturia x3, and urinary frequency, difficulty postponing urination, and a weakened urinary stream, Rx Saw Palmetto. Note, the patient's father developed carcinoma of the prostate [birth year] 25, 1963, and died secondary to carcinoma of the prostate at age 68. The patient had PSA of 2.7, which has risen to 5.1, as noted by Dr. Georgina Pillion. Note that the patient has increased coffee intake, and this may be related to his urinary frequency. He has no gross hematuria. Prostate 47.69cc in size. PBx negative for Calcifications. BPH only, and slight inflammation. PSA 4.35/11.3%. Comparison of PSA  shows PSA 2009=4.97, PSA, 2010=4.35, and PSA June, 2011=4.0.      Statement of  Likelihood of Success: Excellent. TIME-OUT observed.:  Procedure:  Cystourethroscopy showed trilobar BPH with a large median lobe. The bladder base was within normal limits with clear reflux from both ureteral orifices which were located normally on the trigone. Moderate trabeculation was noted with cellule formation and early diverticular formation. There was no evidence of bladder tumor, but there was edema from chronic Foley catheterization. It is noted that the patient's the laser filtration rate had improved from 35 cc per minute to 65 cc per minute with Foley catheterization.   Attention was directed to the median lobe, where resection was begun at the 7:00 position, carried to the 5:00 position. Following elimination of the median lobe, resection was accomplished from the 11:00 to the 7:00 position, and from the 1:00 to the 5:00 positions. The vera was maintained throughout the case. The prostate was coagulated extensively. Chips were evacuated the bladder, and incised 22 three-way hematuria catheter was placed to traction and continuous irrigation. The patient tolerated procedure well. He developed atrial flutter toward the end of the procedure, without change in his blood pressure. About cardiology will be called for consultation postoperatively. The patient was awakened and taken to recovery room in good condition.

## 2012-11-24 NOTE — Consult Note (Signed)
Referring Physician: Patsi Sears Primary Cardiologist: Eden Emms Reason for Consultation: Intra-operative AFL   HPI:  Keith Jones is a 62 y/o male h/o RBBB and non-obstructive CAD. He had echo 01/07/10 which showed EF 45% with abnormal septal motion, inferobasal hypokinesis and mild MR. Cardiac CT 12/11 with calcium score 150 and no critical CAD. Had f/u cMRI in 2012 with EF 51%  Has been doing well without cardiac symptoms. Recently developed urinary retention and has had indwellinng Foley. Underwent TURP today and developed AFL intra-operatively with variable block. (strips in chart). Now back in SR. Feels fine.  Denies exertional CP or SOB. Very rare palpitations at home. No syncope or presyncope.  CHADS score = 0     Review of Systems:     Cardiac Review of Systems: {Y] = yes [ ]  = no  Chest Pain [    ]  Resting SOB [   ] Exertional SOB  [  ]  Orthopnea [  ]   Pedal Edema [   ]    Palpitations [  ] Syncope  [  ]   Presyncope [   ]  General Review of Systems: [Y] = yes [  ]=no Constitional: recent weight change [  ]; anorexia [  ]; fatigue [  ]; nausea [  ]; night sweats [  ]; fever [  ]; or chills [  ];                                                                                                                                          Dental: poor dentition[  ];   Eye : blurred vision [  ]; diplopia [   ]; vision changes [  ];  Amaurosis fugax[  ]; Resp: cough [  ];  wheezing[  ];  hemoptysis[  ]; shortness of breath[  ]; paroxysmal nocturnal dyspnea[  ]; dyspnea on exertion[  ]; or orthopnea[  ];  GI:  gallstones[  ], vomiting[  ];  dysphagia[  ]; melena[  ];  hematochezia [  ]; heartburn[  ];    GU: kidney stones [  ]; hematuria[  ];   dysuria [  ];  nocturia[  ];  history of     obstruction [  y];                 Skin: rash, swelling[  ];, hair loss[  ];  peripheral edema[  ];  or itching[  ]; Musculosketetal: myalgias[  ];  joint swelling[  ];  joint erythema[  ];  joint pain[  ];   back pain[  ];  Heme/Lymph: bruising[  ];  bleeding[  ];  anemia[  ];  Neuro: TIA[  ];  headaches[  ];  stroke[  ];  vertigo[  ];  seizures[  ];   paresthesias[  ];  difficulty walking[  ];  Psych:depression[  ];  anxiety[  ];  Endocrine: diabetes[  ];  thyroid dysfunction[  ];  Other:  Past Medical History  Diagnosis Date  . BPH (benign prostatic hypertrophy)   . Erectile dysfunction   . Insomnia   . RBBB (right bundle branch block with left anterior fascicular block)   . H/O seasonal allergies 11-16-12    allergy shots past, now no problems    Medications Prior to Admission  Medication Sig Dispense Refill  . Eszopiclone (ESZOPICLONE) 3 MG TABS Take 3 mg by mouth at bedtime. Take immediately before bedtime      . aspirin EC 81 MG tablet Take 81 mg by mouth daily.      . saw palmetto 160 MG capsule Take 160 mg by mouth 2 (two) times daily.           . ciprofloxacin  500 mg Oral Q12H  . HYDROmorphone        Infusions: . sodium chloride      No Known Allergies  History   Social History  . Marital Status: Married    Spouse Name: N/A    Number of Children: N/A  . Years of Education: N/A   Occupational History  . sedentary    Social History Main Topics  . Smoking status: Never Smoker   . Smokeless tobacco: Not on file  . Alcohol Use: Yes     Comment: rare occ.- wine/beer  . Drug Use: No  . Sexual Activity: Not on file   Other Topics Concern  . Not on file   Social History Narrative  . No narrative on file    Family History  Problem Relation Age of Onset  . Other      noncontributory  No FHx of premature CAD or HF  PHYSICAL EXAM: Filed Vitals:   11/24/12 1300  BP:   Pulse:   Temp:   Resp: 12   BP 138/70 HR 60s  Intake/Output Summary (Last 24 hours) at 11/24/12 1442 Last data filed at 11/24/12 1208  Gross per 24 hour  Intake   1200 ml  Output   1050 ml  Net    150 ml    General:  Well appearing. No respiratory difficulty HEENT:  normal Neck: supple. no JVD. Carotids 2+ bilat; no bruits. No lymphadenopathy or thryomegaly appreciated. Cor: PMI nondisplaced. Regular rate & rhythm. No rubs, gallops or murmurs. Lungs: clear Abdomen: soft, nontender, nondistended. No hepatosplenomegaly. No bruits or masses. Good bowel sounds. Extremities: no cyanosis, clubbing, rash, edema Neuro: alert & oriented x 3, cranial nerves grossly intact. moves all 4 extremities w/o difficulty. Affect pleasant.  ECG: NSR 65 RBBB  Results for orders placed during the hospital encounter of 11/24/12 (from the past 24 hour(s))  BASIC METABOLIC PANEL     Status: Abnormal   Collection Time    11/24/12 11:55 AM      Result Value Range   Sodium 139  135 - 145 mEq/L   Potassium 3.7  3.5 - 5.1 mEq/L   Chloride 105  96 - 112 mEq/L   CO2 27  19 - 32 mEq/L   Glucose, Bld 97  70 - 99 mg/dL   BUN 17  6 - 23 mg/dL   Creatinine, Ser 1.61  0.50 - 1.35 mg/dL   Calcium 9.0  8.4 - 09.6 mg/dL   GFR calc non Af Amer 76 (*) >90 mL/min   GFR calc Af Amer 88 (*) >90 mL/min  HEMOGLOBIN AND HEMATOCRIT, BLOOD  Status: Abnormal   Collection Time    11/24/12 11:55 AM      Result Value Range   Hemoglobin 12.6 (*) 13.0 - 17.0 g/dL   HCT 04.5 (*) 40.9 - 81.1 %   No results found.   ASSESSMENT: 1. Intra-operative AFL, transient - CHADS2 = 0 2. RBBB, chronic 3. Low normal EF 4. Urinary retention S/p TURP   PLAN/DISCUSSION:  He is now back in SR. AFL likely due to operative stress. Would not pursue further w/u at this point. If develops palpitations as an outpatient can get event monitor. Would restart ASA when OK from surgical perspective. We will sign off. Please call with questions.   Daniel Bensimhon,MD 3:04 PM

## 2012-11-24 NOTE — Anesthesia Postprocedure Evaluation (Signed)
Anesthesia Post Note  Patient: Keith Jones  Procedure(s) Performed: Procedure(s) (LRB): TRANSURETHRAL RESECTION OF THE PROSTATE WITH GYRUS INSTRUMENTS (N/A) CYSTOSCOPY (N/A)  Anesthesia type: General  Patient location: PACU  Post pain: Pain level controlled  Post assessment: Post-op Vital signs reviewed  Last Vitals:  Filed Vitals:   11/24/12 1715  BP: 112/69  Pulse: 61  Temp:   Resp: 12    Post vital signs: Reviewed  Level of consciousness: sedated  Complications: No apparent anesthesia complications

## 2012-11-24 NOTE — Transfer of Care (Signed)
Immediate Anesthesia Transfer of Care Note  Patient: Keith Jones  Procedure(s) Performed: Procedure(s): TRANSURETHRAL RESECTION OF THE PROSTATE WITH GYRUS INSTRUMENTS (N/A) CYSTOSCOPY (N/A)  Patient Location: PACU  Anesthesia Type:General  Level of Consciousness: awake, alert  and patient cooperative  Airway & Oxygen Therapy: Patient Spontanous Breathing and Patient connected to face mask oxygen  Post-op Assessment: Report given to PACU RN and Post -op Vital signs reviewed and stable  Post vital signs: Reviewed and stable  Complications: No apparent anesthesia complications

## 2012-11-25 LAB — BASIC METABOLIC PANEL
Calcium: 8.9 mg/dL (ref 8.4–10.5)
Creatinine, Ser: 1.08 mg/dL (ref 0.50–1.35)
GFR calc non Af Amer: 72 mL/min — ABNORMAL LOW (ref >90)
Glucose, Bld: 121 mg/dL — ABNORMAL HIGH (ref 70–99)
Sodium: 136 mEq/L (ref 135–145)

## 2012-11-25 LAB — HEMOGLOBIN AND HEMATOCRIT, BLOOD
HCT: 36.3 % — ABNORMAL LOW (ref 39.0–52.0)
Hemoglobin: 12.3 g/dL — ABNORMAL LOW (ref 13.0–17.0)

## 2012-11-25 MED ORDER — DOCUSATE SODIUM 250 MG PO CAPS: 250.0000 mg | ORAL_CAPSULE | Freq: Every day | ORAL | Status: DC

## 2012-11-25 MED ORDER — CIPROFLOXACIN HCL 500 MG PO TABS: 500.0000 mg | ORAL_TABLET | Freq: Two times a day (BID) | ORAL | Status: AC

## 2012-11-25 MED ORDER — ASPIRIN EC 81 MG PO TBEC: 81.0000 mg | DELAYED_RELEASE_TABLET | Freq: Every day | ORAL | Status: DC

## 2012-11-25 MED ORDER — OXYCODONE-ACETAMINOPHEN 5-325 MG PO TABS: 1.0000 | ORAL_TABLET | ORAL | Status: DC | PRN

## 2012-11-25 NOTE — Discharge Summary (Signed)
Date of admission: 11/24/2012  Date of discharge: 11/25/2012  Admission diagnosis: bladder outlet obstruction  Discharge diagnosis: bladder outlet obstruction  Secondary diagnoses: aflutter  History and Physical: For full details, please see admission history and physical. Briefly, Keith Jones is a 62 y.o. year old patient with LUTS and urinary retention who presents for TURP.   Hospital Course: Intraoperatively patient had aflutter but converted to sinus rhythm spontaneoulsy.  He was placed in the step-down and placed on telemetry where he had no further arrhythmias.  He was seen by cardiology who recommended no further work-up.  His hospital course was otherwise uncomplicated.  He was weaned off CBI on POD#1, tolerating a regular diet and ambulating independently.  He was deemed safe for discharge, and will be scheduled for a voiding trial on Monday.  Laboratory values:  Recent Labs  11/24/12 1155 11/25/12 0405  HGB 12.6* 12.3*  HCT 36.8* 36.3*    Recent Labs  11/24/12 1155 11/25/12 0405  CREATININE 1.04 1.08    Disposition: Home  Discharge medications:    Medication List    STOP taking these medications       saw palmetto 160 MG capsule      TAKE these medications       aspirin EC 81 MG tablet  Take 1 tablet (81 mg total) by mouth daily. Resume once bleeding has stopped.     ciprofloxacin 500 MG tablet  Commonly known as:  CIPRO  Take 1 tablet (500 mg total) by mouth every 12 (twelve) hours.     eszopiclone 3 MG Tabs  Generic drug:  Eszopiclone  Take 3 mg by mouth at bedtime. Take immediately before bedtime     oxyCODONE-acetaminophen 5-325 MG per tablet  Commonly known as:  PERCOCET/ROXICET  Take 1-2 tablets by mouth every 4 (four) hours as needed.        Followup:  Monday for voiding trial with Dr. Patsi Sears.

## 2012-11-25 NOTE — Progress Notes (Signed)
POD#1 s/p TURP  Intv: No issues o/n on tele  No complaints this AM. Urine clear on gentle CBI Pain well controlled.  PE Filed Vitals:   11/25/12 0520  BP: 113/55  Pulse: 60  Temp:   Resp: 9    Intake/Output Summary (Last 24 hours) at 11/25/12 1059 Last data filed at 11/25/12 0700  Gross per 24 hour  Intake   2200 ml  Output   9500 ml  Net  -7300 ml   NAD Abdomen soft Foley draining straw colored urine on slow CBI  Results for Keith, Jones (MRN [id number]) as of 11/25/2012 11:05  Ref. Range 11/25/2012 04:05  Sodium Latest Range: 135-145 mEq/L 136  Potassium Latest Range: 3.5-5.1 mEq/L 4.0  Chloride Latest Range: 96-112 mEq/L 102  CO2 Latest Range: 19-32 mEq/L 25  BUN Latest Range: 6-23 mg/dL 20  Creatinine Latest Range: 0.50-1.35 mg/dL 7.82  Calcium Latest Range: 8.4-10.5 mg/dL 8.9  GFR calc non Af Amer Latest Range: >90 mL/min 72 (L)  GFR calc Af Amer Latest Range: >90 mL/min 84 (L)  Glucose Latest Range: 70-99 mg/dL 956 (H)  Hemoglobin Latest Range: 13.0-17.0 g/dL 21.3 (L)  HCT Latest Range: 39.0-52.0 % 36.3 (L)    Imp: POD#1 from TURP, doing well. Plan: Wean CBI to off D/c home with foley, voiding trial on Monday

## 2012-11-27 ENCOUNTER — Encounter (HOSPITAL_COMMUNITY): Payer: Self-pay | Admitting: Urology

## 2015-03-19 DIAGNOSIS — Z0000 Encounter for general adult medical examination without abnormal findings: Secondary | ICD-10-CM | POA: Diagnosis not present

## 2015-03-19 DIAGNOSIS — H538 Other visual disturbances: Secondary | ICD-10-CM | POA: Diagnosis not present

## 2015-03-19 DIAGNOSIS — Z125 Encounter for screening for malignant neoplasm of prostate: Secondary | ICD-10-CM | POA: Diagnosis not present

## 2015-03-19 DIAGNOSIS — G4700 Insomnia, unspecified: Secondary | ICD-10-CM | POA: Diagnosis not present

## 2015-03-19 DIAGNOSIS — Z13220 Encounter for screening for lipoid disorders: Secondary | ICD-10-CM | POA: Diagnosis not present

## 2015-03-19 MED FILL — ESZOPICLONE 3 MG TABLET: 3 | 30 days supply | Qty: 30 | Fill #0

## 2015-04-15 DIAGNOSIS — H2513 Age-related nuclear cataract, bilateral: Secondary | ICD-10-CM | POA: Diagnosis not present

## 2015-04-16 MED FILL — VIAGRA 50 MG TABLET: 50 | 30 days supply | Qty: 6 | Fill #0

## 2015-05-23 MED FILL — VIAGRA 50 MG TABLET: 50 | 30 days supply | Qty: 6 | Fill #1

## 2015-06-17 DIAGNOSIS — L578 Other skin changes due to chronic exposure to nonionizing radiation: Secondary | ICD-10-CM | POA: Diagnosis not present

## 2015-06-17 DIAGNOSIS — L570 Actinic keratosis: Secondary | ICD-10-CM | POA: Diagnosis not present

## 2015-06-17 DIAGNOSIS — L812 Freckles: Secondary | ICD-10-CM | POA: Diagnosis not present

## 2015-06-17 DIAGNOSIS — L821 Other seborrheic keratosis: Secondary | ICD-10-CM | POA: Diagnosis not present

## 2015-06-17 DIAGNOSIS — D225 Melanocytic nevi of trunk: Secondary | ICD-10-CM | POA: Diagnosis not present

## 2015-06-17 DIAGNOSIS — D1801 Hemangioma of skin and subcutaneous tissue: Secondary | ICD-10-CM | POA: Diagnosis not present

## 2015-06-17 MED FILL — FLUTICASONE PROP 0.05% CRM: 0.05 | 14 days supply | Qty: 30 | Fill #0

## 2015-06-18 MED FILL — ESZOPICLONE 3 MG TABLET: 3 | 30 days supply | Qty: 30 | Fill #1

## 2015-07-08 MED FILL — VIAGRA 50 MG TABLET: 50 | 30 days supply | Qty: 6 | Fill #2

## 2015-07-15 DIAGNOSIS — L570 Actinic keratosis: Secondary | ICD-10-CM | POA: Diagnosis not present

## 2015-07-29 MED FILL — VIAGRA 50 MG TABLET: 50 | 30 days supply | Qty: 6 | Fill #3

## 2015-09-15 MED FILL — ESZOPICLONE 3 MG TABLET: 3 | 30 days supply | Qty: 30 | Fill #2

## 2015-11-14 MED FILL — ESZOPICLONE 3 MG TABLET: 3 | 30 days supply | Qty: 30 | Fill #0

## 2015-12-04 MED FILL — VIAGRA 50 MG TABLET: 50 | 30 days supply | Qty: 6 | Fill #4

## 2016-01-05 MED FILL — VIAGRA 50 MG TABLET: 50 | 30 days supply | Qty: 6 | Fill #5

## 2016-01-22 DIAGNOSIS — L219 Seborrheic dermatitis, unspecified: Secondary | ICD-10-CM | POA: Diagnosis not present

## 2016-01-22 DIAGNOSIS — L570 Actinic keratosis: Secondary | ICD-10-CM | POA: Diagnosis not present

## 2016-01-22 DIAGNOSIS — L814 Other melanin hyperpigmentation: Secondary | ICD-10-CM | POA: Diagnosis not present

## 2016-02-09 MED FILL — VIAGRA 50 MG TABLET: 50 | 30 days supply | Qty: 6 | Fill #6

## 2016-03-03 MED FILL — ESZOPICLONE 3 MG TABLET: 3 | 30 days supply | Qty: 30 | Fill #1

## 2016-05-05 MED FILL — ESZOPICLONE 3 MG TABLET: 3 | 30 days supply | Qty: 30 | Fill #2

## 2016-05-28 MED FILL — SILDENAFIL 50 MG TABLET: 50 | 30 days supply | Qty: 6 | Fill #0

## 2016-06-28 DIAGNOSIS — M65331 Trigger finger, right middle finger: Secondary | ICD-10-CM | POA: Diagnosis not present

## 2016-06-28 DIAGNOSIS — M79641 Pain in right hand: Secondary | ICD-10-CM | POA: Diagnosis not present

## 2016-07-22 DIAGNOSIS — L814 Other melanin hyperpigmentation: Secondary | ICD-10-CM | POA: Diagnosis not present

## 2016-07-22 DIAGNOSIS — D1801 Hemangioma of skin and subcutaneous tissue: Secondary | ICD-10-CM | POA: Diagnosis not present

## 2016-07-22 DIAGNOSIS — D225 Melanocytic nevi of trunk: Secondary | ICD-10-CM | POA: Diagnosis not present

## 2016-07-22 DIAGNOSIS — D485 Neoplasm of uncertain behavior of skin: Secondary | ICD-10-CM | POA: Diagnosis not present

## 2016-07-22 MED FILL — SILDENAFIL 50 MG TABLET: 50 | 30 days supply | Qty: 6 | Fill #1

## 2016-08-27 DIAGNOSIS — G4700 Insomnia, unspecified: Secondary | ICD-10-CM | POA: Diagnosis not present

## 2016-08-27 DIAGNOSIS — Z0000 Encounter for general adult medical examination without abnormal findings: Secondary | ICD-10-CM | POA: Diagnosis not present

## 2016-08-27 DIAGNOSIS — N5201 Erectile dysfunction due to arterial insufficiency: Secondary | ICD-10-CM | POA: Diagnosis not present

## 2016-08-27 DIAGNOSIS — Z23 Encounter for immunization: Secondary | ICD-10-CM | POA: Diagnosis not present

## 2016-08-27 DIAGNOSIS — Z125 Encounter for screening for malignant neoplasm of prostate: Secondary | ICD-10-CM | POA: Diagnosis not present

## 2016-08-27 DIAGNOSIS — Z13220 Encounter for screening for lipoid disorders: Secondary | ICD-10-CM | POA: Diagnosis not present

## 2016-08-27 MED FILL — ESZOPICLONE 3 MG TABLET: 3 | 30 days supply | Qty: 30 | Fill #0

## 2016-08-27 MED FILL — SILDENAFIL 50 MG TABLET: 50 | 30 days supply | Qty: 6 | Fill #2

## 2017-01-20 MED FILL — ESZOPICLONE 3 MG TABLET: 3 | 30 days supply | Qty: 30 | Fill #1

## 2017-02-02 MED FILL — SILDENAFIL CITRATE 50 MG TA: 50 | 30 days supply | Qty: 6 | Fill #3

## 2017-04-05 MED FILL — SILDENAFIL CITRATE 50 MG TA: 50 | 30 days supply | Qty: 6 | Fill #4

## 2017-04-18 MED FILL — ESZOPICLONE 3 MG TABLET: 3 | 30 days supply | Qty: 30 | Fill #0

## 2017-05-10 MED FILL — SILDENAFIL CITRATE 50 MG TA: 50 | 30 days supply | Qty: 6 | Fill #5

## 2017-08-22 ENCOUNTER — Ambulatory Visit (HOSPITAL_COMMUNITY)
Admission: EM | Admit: 2017-08-22 | Discharge: 2017-08-22 | Disposition: A | Discharge status: Home / Self Care | Payer: No Typology Code available for payment source | Attending: Internal Medicine | Admitting: Internal Medicine

## 2017-08-22 DIAGNOSIS — R040 Epistaxis: Secondary | ICD-10-CM

## 2017-08-22 LAB — POCT I-STAT, CHEM 8
BUN: 17 mg/dL (ref 6–20)
CREATININE: 1.1 mg/dL (ref 0.61–1.24)
Calcium, Ion: 1.23 mmol/L (ref 1.15–1.40)
Chloride: 98 mmol/L — ABNORMAL LOW (ref 101–111)
Glucose, Bld: 85 mg/dL (ref 65–99)
HEMATOCRIT: 44 % (ref 39.0–52.0)
HEMOGLOBIN: 15 g/dL (ref 13.0–17.0)
POTASSIUM: 3.9 mmol/L (ref 3.5–5.1)
Sodium: 138 mmol/L (ref 135–145)
TCO2: 28 mmol/L (ref 22–32)

## 2017-08-22 MED ORDER — OXYMETAZOLINE HCL 0.05 % NA SOLN
NASAL | Status: AC
  Filled 2017-08-22: qty 15

## 2017-08-22 MED ORDER — SALINE SPRAY 0.65 % NA SOLN: 1.0000 | NASAL | 0 refills | Status: DC | PRN

## 2017-08-22 NOTE — ED Triage Notes (Signed)
Nosebleed started this morning upon waking

## 2017-08-22 NOTE — ED Provider Notes (Signed)
Keith Jones    CSN: [id number] Arrival date & time: 08/22/17  1002     History   Chief Complaint Chief Complaint  Patient presents with  . Epistaxis    HPI Keith Jones is a 67 y.o. male.   Keith Jones presents with complaints of nose bleed which started this morning approximately 1.5-2 hours ago. No known trigger/injury or frequent blowing of the nose. No pain. States he has had some vertigo symptoms for the past few days but mild, no nausea or vomiting. Hx of allergies but without any significant symptoms. Has had similar nose bleed in the past, approximately 10 years ago. Saw ENT and was told if persistent symptoms may need surgery due to a large vessel. Does not take blood pressure medications. Takes a baby aspirin, this is the only medication he has taken. Hx of bph, RBBB, allergies, a flutter.    ROS per HPI.      Past Medical History:  Diagnosis Date  . BPH (benign prostatic hypertrophy)   . Erectile dysfunction   . H/O seasonal allergies 11-16-12   allergy shots past, now no problems  . Insomnia   . RBBB (right bundle branch block with left anterior fascicular block)     Patient Active Problem List   Diagnosis Date Noted  . Atrial flutter ([place]) 11/24/2012  . CARDIOMYOPATHY 02/04/2010  . SHORTNESS OF BREATH 02/04/2010  . RBBB 02/03/2010  . INSOMNIA 02/03/2010  . ERECTILE DYSFUNCTION, ORGANIC, HX OF 02/03/2010    Past Surgical History:  Procedure Laterality Date  . CYSTOSCOPY N/A 11/24/2012   Procedure: CYSTOSCOPY;  Surgeon: Ailene Rud, MD;  Location: [facility];  Service: Urology;  Laterality: N/A;  . TONSILLECTOMY    . TRANSURETHRAL RESECTION OF PROSTATE N/A 11/24/2012   Procedure: TRANSURETHRAL RESECTION OF THE PROSTATE WITH GYRUS INSTRUMENTS;  Surgeon: Ailene Rud, MD;  Location: [facility];  Service: Urology;  Laterality: N/A;       Home Medications    Prior to Admission medications   Medication Sig Start Date End Date Taking?  Authorizing Provider  aspirin EC 81 MG tablet Take 1 tablet (81 mg total) by mouth daily. Resume once bleeding has stopped. 11/25/12   Ardis Hughs, MD  docusate sodium (COLACE) 250 MG capsule Take 1 capsule (250 mg total) by mouth daily. 11/25/12   Ardis Hughs, MD  Eszopiclone (ESZOPICLONE) 3 MG TABS Take 3 mg by mouth at bedtime. Take immediately before bedtime    [provider]  oxyCODONE-acetaminophen (PERCOCET/ROXICET) 5-325 MG per tablet Take 1-2 tablets by mouth every 4 (four) hours as needed. 11/25/12   Ardis Hughs, MD  sodium chloride (OCEAN) 0.65 % SOLN nasal spray Place 1 spray into both nostrils as needed for congestion. 08/22/17   Zigmund Gottron, NP    Family History Family History  Problem Relation Age of Onset  . Other Unknown        noncontributory    Social History Social History   Tobacco Use  . Smoking status: Never Smoker  Substance Use Topics  . Alcohol use: Yes    Comment: rare occ.- wine/beer  . Drug use: No     Allergies   Patient has no known allergies.   Review of Systems Review of Systems   Physical Exam Triage Vital Signs ED Triage Vitals [08/22/17 1003]  Enc Vitals Group     BP (!) 153/87     Pulse      Resp 18  Temp 98.1 F (36.7 C)     Temp Source Oral     SpO2 96 %     Weight      Height      Head Circumference      Peak Flow      Pain Score      Pain Loc      Pain Edu?      Excl. in [place]?    No data found.  Updated Vital Signs BP (!) 153/87 (BP Location: Left Arm)   Temp 98.1 F (36.7 C) (Oral)   Resp 18   SpO2 96%   Visual Acuity Right Eye Distance:   Left Eye Distance:   Bilateral Distance:    Right Eye Near:   Left Eye Near:    Bilateral Near:     Physical Exam  Constitutional: He is oriented to person, place, and time. He appears well-developed and well-nourished.  HENT:  Head: Normocephalic and atraumatic.  Right Ear: Tympanic membrane, external ear and ear canal normal.    Left Ear: Tympanic membrane, external ear and ear canal normal.  Nose: Epistaxis is observed.  Left nares with epistaxis; large clot removed; afrin utilized; appears to be anterior in nature with very slow slight bleed; slight ooze after afrin and pressure held. Soaked gauze anterior packing placed   Cardiovascular: Normal rate and regular rhythm.  Pulmonary/Chest: Effort normal and breath sounds normal.  Neurological: He is alert and oriented to person, place, and time.  Skin: Skin is warm and dry.     UC Treatments / Results  Labs (all labs ordered are listed, but only abnormal results are displayed) Labs Reviewed  POCT I-STAT, CHEM 8 - Abnormal; Notable for the following components:      Result Value   Chloride 98 (*)    All other components within normal limits    EKG None  Radiology No results found.  Procedures Procedures (including critical care time)  Medications Ordered in UC Medications - No data to display  Initial Impression / Assessment and Plan / UC Course  I have reviewed the triage vital signs and the nursing notes.  Pertinent labs & imaging results that were available during my care of the patient were reviewed by me and considered in my medical decision making (see chart for details).     No active bleeding s/p anterior gauze packing with afrin. No postnasal bleeding. Chem 8 reassuring. Patient feeling well. Saline syringes provided to saturate gauze prior to removal tonight or tomorrow morning. No heavy lifting. Nasal saline to moisturize nares. Meclizine as needed. Return precautions provided. Patient verbalized understanding and agreeable to plan.  Ambulatory out of clinic without difficulty.    Final Clinical Impressions(s) / UC Diagnoses   Final diagnoses:  Left-sided epistaxis     Discharge Instructions     Take it easy today, no heavy lifting or activity.  May leave gauze in place until tonight or tomorrow morning.  Use saline to moisten  gauze before removing. May use the afrin twice a day for the next 2 days. Follow up with ENT if any residual or recurrent bleeding. If worsening or increased bleeding may return here for further packing or go to Er.  Meclizine may help with vertigo.  Please follow up with your primary care provider for recheck in the next week, return sooner if headache, worsening of dizziness, vision change or weakness     ED Prescriptions    Medication Sig Dispense Auth. Provider  sodium chloride (OCEAN) 0.65 % SOLN nasal spray Place 1 spray into both nostrils as needed for congestion. 60 mL Augusto Gamble B, NP     Controlled Substance Prescriptions [place] Controlled Substance Registry consulted? Not Applicable   Zigmund Gottron, NP 08/22/17 1118

## 2017-08-22 NOTE — Discharge Instructions (Signed)
Take it easy today, no heavy lifting or activity.  May leave gauze in place until tonight or tomorrow morning.  Use saline to moisten gauze before removing. May use the afrin twice a day for the next 2 days. Follow up with ENT if any residual or recurrent bleeding. If worsening or increased bleeding may return here for further packing or go to Er.  Meclizine may help with vertigo.  Please follow up with your primary care provider for recheck in the next week, return sooner if headache, worsening of dizziness, vision change or weakness

## 2017-09-02 MED FILL — ESZOPICLONE 3 MG TABS: 3 | 30 days supply | Qty: 30 | Fill #0

## 2017-09-02 MED FILL — SILDENAFIL CITRATE 50 MG TA: 50 | 50 days supply | Qty: 10 | Fill #0

## 2017-10-07 MED FILL — CHLORHEXIDINE 0.12% RINSE: 0.12 | 30 days supply | Qty: 473 | Fill #0

## 2017-10-07 MED FILL — AMOXICILLIN 875 MG TABLET: 875 | 10 days supply | Qty: 20 | Fill #0

## 2017-10-18 MED FILL — AMOXICILLIN 875 MG TABLET: 875 | 10 days supply | Qty: 20 | Fill #1

## 2017-11-08 MED FILL — SILDENAFIL CITRATE 50 MG TA: 50 | 50 days supply | Qty: 10 | Fill #1

## 2017-12-13 MED FILL — LATANOPROST 0.005% EYE DRP: 0.005 | 25 days supply | Qty: 3 | Fill #0

## 2018-01-19 MED FILL — ESZOPICLONE 3 MG TABS: 3 | 30 days supply | Qty: 30 | Fill #1

## 2018-03-20 MED FILL — SHINGRIX 50 MCG SUS: 50 | 1 days supply | Qty: 1 | Fill #0

## 2018-03-24 MED FILL — SILDENAFIL CITRATE 50 MG TA: 50 | 50 days supply | Qty: 10 | Fill #2

## 2018-05-06 MED FILL — SILDENAFIL CITRATE 50 MG TA: 50 | 50 days supply | Qty: 10 | Fill #3

## 2018-06-06 ENCOUNTER — Other Ambulatory Visit: Payer: Self-pay

## 2018-06-06 ENCOUNTER — Ambulatory Visit: Payer: Self-pay | Admitting: Family Medicine

## 2018-06-13 ENCOUNTER — Ambulatory Visit: Payer: Medicare HMO | Admitting: Family Medicine

## 2018-06-13 ENCOUNTER — Encounter: Payer: Self-pay | Admitting: Family Medicine

## 2018-06-13 ENCOUNTER — Other Ambulatory Visit: Payer: Self-pay

## 2018-06-13 VITALS — BP 154/109 | HR 73 | Temp 99.2°F | Ht 72.0 in | Wt 180.0 lb

## 2018-06-13 DIAGNOSIS — M7661 Achilles tendinitis, right leg: Secondary | ICD-10-CM | POA: Diagnosis not present

## 2018-06-13 DIAGNOSIS — M545 Low back pain, unspecified: Secondary | ICD-10-CM

## 2018-06-13 NOTE — Patient Instructions (Signed)
You have Achilles Tendinopathy Ibuprofen 600mg  three times a day with food OR aleve 2 tabs twice a day with food for pain and inflammation if needed. Calf raises 3 sets of 10 on level ground once a day first. When these are easy, can do them one legged 3 sets of 10. Finally advance to doing them on a step. Can add heel walks, toe walks forward and backward as well Ice bucket 10-15 minutes at end of day - can ice 3-4 times a day. Avoid uneven ground, hills as much as possible. Heel lifts in shoes or shoes with a natural heel lift. Consider physical therapy, orthotics, nitro patches if not improving as expected. Follow up in 6 weeks.  For your back continue with the pelvic tilt, cobra, cat/camel exercises. Add planks, crunches also.

## 2018-06-14 ENCOUNTER — Encounter: Payer: Self-pay | Admitting: Family Medicine

## 2018-06-14 NOTE — Progress Notes (Signed)
PCP and consultation requested by: Lujean Amel, MD  Subjective:   HPI: Patient is a 68 y.o. male here for right achilles pain.  Patient reports he's had several months of posterior right heel/achilles pain. No known injury or trauma. Remembers feeding his dog that involved stooping and puts more weight on this side but really didn't injure himself doing this. Worse uphill. Calf feels weak. Doing some stretching. Cannot walk fast due to this. Taking motrin with mild benefit. Pain currently is 0/10 and more of a stiffness. He was also experiencing low back pain but over past week he's been doing home exercises (describes pelvic tilt, cobra, cat/camel) and pain is significantly improved. No radiation into extremities. No numbness. No bowel/bladder dysfunction.  Past Medical History:  Diagnosis Date  . BPH (benign prostatic hypertrophy)   . Erectile dysfunction   . H/O seasonal allergies 11-16-12   allergy shots past, now no problems  . Insomnia   . RBBB (right bundle branch block with left anterior fascicular block)     Current Outpatient Medications on File Prior to Visit  Medication Sig Dispense Refill  . aspirin EC 81 MG tablet Take 1 tablet (81 mg total) by mouth daily. Resume once bleeding has stopped.    . docusate sodium (COLACE) 250 MG capsule Take 1 capsule (250 mg total) by mouth daily. 30 capsule 0  . Eszopiclone (ESZOPICLONE) 3 MG TABS Take 3 mg by mouth at bedtime. Take immediately before bedtime    . sildenafil (VIAGRA) 50 MG tablet     . sodium chloride (OCEAN) 0.65 % SOLN nasal spray Place 1 spray into both nostrils as needed for congestion. 60 mL 0   No current facility-administered medications on file prior to visit.     Past Surgical History:  Procedure Laterality Date  . CYSTOSCOPY N/A 11/24/2012   Procedure: CYSTOSCOPY;  Surgeon: Ailene Rud, MD;  Location: [facility];  Service: Urology;  Laterality: N/A;  . TONSILLECTOMY    . TRANSURETHRAL  RESECTION OF PROSTATE N/A 11/24/2012   Procedure: TRANSURETHRAL RESECTION OF THE PROSTATE WITH GYRUS INSTRUMENTS;  Surgeon: Ailene Rud, MD;  Location: [facility];  Service: Urology;  Laterality: N/A;    No Known Allergies  Social History   Socioeconomic History  . Marital status: Married    Spouse name: Not on file  . Number of children: Not on file  . Years of education: Not on file  . Highest education level: Not on file  Occupational History  . Occupation: sedentary    Employer: RETIRED  Social Needs  . Financial resource strain: Not on file  . Food insecurity:    Worry: Not on file    Inability: Not on file  . Transportation needs:    Medical: Not on file    Non-medical: Not on file  Tobacco Use  . Smoking status: Never Smoker  . Smokeless tobacco: Never Used  Substance and Sexual Activity  . Alcohol use: Yes    Comment: rare occ.- wine/beer  . Drug use: No  . Sexual activity: Not on file  Lifestyle  . Physical activity:    Days per week: Not on file    Minutes per session: Not on file  . Stress: Not on file  Relationships  . Social connections:    Talks on phone: Not on file    Gets together: Not on file    Attends religious service: Not on file    Active member of club or organization: Not  on file    Attends meetings of clubs or organizations: Not on file    Relationship status: Not on file  . Intimate partner violence:    Fear of current or ex partner: Not on file    Emotionally abused: Not on file    Physically abused: Not on file    Forced sexual activity: Not on file  Other Topics Concern  . Not on file  Social History Narrative  . Not on file    Family History  Problem Relation Age of Onset  . Other Unknown        noncontributory    BP (!) 154/109   Pulse 73   Temp 99.2 F (37.3 C) (Oral)   Ht 6' (1.829 m)   Wt 180 lb (81.6 kg)   BMI 24.41 kg/m   Review of Systems: See HPI above.     Objective:  Physical Exam:  Gen: NAD,  comfortable in exam room  Right foot/ankle: Thickening of achilles from 3-6cm proximal to insertion on calcaneus.  No other gross deformity.  No ecchymoses. FROM with 5/5 strength all directions.  Able to do double leg calf raise with minimal discomfort. TTP mildly in area of achilles noted above.  No other tenderness of foot or ankle. Negative ant drawer and talar tilt.   Negative syndesmotic compression. Negative calcaneal squeeze. Thompsons test negative. NV intact distally.  Left foot/ankle: No deformity. FROM with 5/5 strength. No tenderness to palpation. NVI distally.  Back: No gross deformity, swelling. No midline or paraspinal tenderness to palpation. Negative logroll of hips.  MSK u/s:  Right achilles with significant thickening and intratendinous swelling - greatest depth 1.5cm with significant neovascularity.     Assessment & Plan:  1. Right achilles tendinopathy - Icing, heel lifts.  Shown home exercise program and how to advance this - from theraband exercises which he's familiar to 2 legged calf raise, to single leg, finally onto step - to progress as pain and function allow.  Consider physical therapy, orthotics.  Discussed nitro patches but he is on sildenafil - would have to be off this for 48 hours and stay off to consider this treatment.  F/u in 6 weeks.  2. Low back pain - added some additional home exercises but this is largely resolved at this point.

## 2018-07-25 ENCOUNTER — Ambulatory Visit: Payer: Medicare HMO | Admitting: Family Medicine

## 2019-09-06 ENCOUNTER — Other Ambulatory Visit (HOSPITAL_COMMUNITY): Payer: Self-pay | Admitting: Family Medicine

## 2019-09-27 MED FILL — SILDENAFIL CITRATE 50 MG TA: 50 | 30 days supply | Qty: 10 | Fill #0

## 2019-10-15 MED FILL — FLUTICASONE PROP 0.05% CRM: 0.05 | 30 days supply | Qty: 60 | Fill #0

## 2019-10-15 MED FILL — SILDENAFIL CITRATE 50 MG TA: 50 | 30 days supply | Qty: 10 | Fill #0

## 2019-10-16 ENCOUNTER — Ambulatory Visit: Payer: Medicare HMO | Admitting: Family Medicine

## 2019-10-16 ENCOUNTER — Other Ambulatory Visit: Payer: Self-pay

## 2019-10-16 VITALS — BP 118/82 | Ht 72.0 in | Wt 180.0 lb

## 2019-10-16 DIAGNOSIS — M25561 Pain in right knee: Secondary | ICD-10-CM | POA: Diagnosis not present

## 2019-10-16 DIAGNOSIS — G8929 Other chronic pain: Secondary | ICD-10-CM

## 2019-10-16 NOTE — Patient Instructions (Signed)
Your pain is due to a meniscus tear in your right knee though it's likely you have underlying arthritis as well. Aleve 1-2 tabs twice a day with food for 1 week then as needed. Knee brace to help with support when up and walking around. Tylenol 500mg  1-2 tabs three times a day for pain. Capsaicin, aspercreme, or biofreeze topically up to four times a day may also help with pain. Some supplements that may help for arthritis: Boswellia extract, curcumin, pycnogenol Cortisone injections are an option. Use the cane as well. It's important that you continue to stay active. Straight leg raises, knee extensions 3 sets of 10 once a day (add ankle weight if these become too easy). Heat or ice 15 minutes at a time 3-4 times a day as needed to help with pain. Follow up with me in 1 month but call me sooner if you're struggling.

## 2019-10-17 ENCOUNTER — Encounter: Payer: Self-pay | Admitting: Family Medicine

## 2019-10-17 NOTE — Progress Notes (Signed)
PCP: Lujean Amel, MD  Subjective:   HPI: Patient is a 69 y.o. male here for right knee pain.  Patient reports his current problem with the right knee started several days ago where he noticed anterior pain with decreased motion. His wife also acted as a historian and stated that he is actually had problems with the right knee dating back several years. Then yesterday when getting back into his car he felt a pop in the right knee as it gave way. Associated swelling but no bruising. No catching or locking. No problems with the left knee.  Past Medical History:  Diagnosis Date  . BPH (benign prostatic hypertrophy)   . Erectile dysfunction   . H/O seasonal allergies 11-16-12   allergy shots past, now no problems  . Insomnia   . RBBB (right bundle branch block with left anterior fascicular block)     Current Outpatient Medications on File Prior to Visit  Medication Sig Dispense Refill  . aspirin EC 81 MG tablet Take 1 tablet (81 mg total) by mouth daily. Resume once bleeding has stopped.    . docusate sodium (COLACE) 250 MG capsule Take 1 capsule (250 mg total) by mouth daily. 30 capsule 0  . Eszopiclone (ESZOPICLONE) 3 MG TABS Take 3 mg by mouth at bedtime. Take immediately before bedtime    . fluticasone (CUTIVATE) 0.05 % cream Apply topically 2 (two) times daily as needed.    Marland Kitchen PICATO 0.015 % GEL Apply 1 application topically daily.    . sildenafil (VIAGRA) 50 MG tablet     . sodium chloride (OCEAN) 0.65 % SOLN nasal spray Place 1 spray into both nostrils as needed for congestion. 60 mL 0   No current facility-administered medications on file prior to visit.    Past Surgical History:  Procedure Laterality Date  . CYSTOSCOPY N/A 11/24/2012   Procedure: CYSTOSCOPY;  Surgeon: Ailene Rud, MD;  Location: [facility];  Service: Urology;  Laterality: N/A;  . TONSILLECTOMY    . TRANSURETHRAL RESECTION OF PROSTATE N/A 11/24/2012   Procedure: TRANSURETHRAL RESECTION OF THE  PROSTATE WITH GYRUS INSTRUMENTS;  Surgeon: Ailene Rud, MD;  Location: [facility];  Service: Urology;  Laterality: N/A;    No Known Allergies  Social History   Socioeconomic History  . Marital status: Married    Spouse name: Not on file  . Number of children: Not on file  . Years of education: Not on file  . Highest education level: Not on file  Occupational History  . Occupation: sedentary    Employer: RETIRED  Tobacco Use  . Smoking status: Never Smoker  . Smokeless tobacco: Never Used  Substance and Sexual Activity  . Alcohol use: Yes    Comment: rare occ.- wine/beer  . Drug use: No  . Sexual activity: Not on file  Other Topics Concern  . Not on file  Social History Narrative  . Not on file   Social Determinants of Health   Financial Resource Strain:   . Difficulty of Paying Living Expenses:   Food Insecurity:   . Worried About Charity fundraiser in the Last Year:   . Arboriculturist in the Last Year:   Transportation Needs:   . Film/video editor (Medical):   Marland Kitchen Lack of Transportation (Non-Medical):   Physical Activity:   . Days of Exercise per Week:   . Minutes of Exercise per Session:   Stress:   . Feeling of Stress :  Social Connections:   . Frequency of Communication with Friends and Family:   . Frequency of Social Gatherings with Friends and Family:   . Attends Religious Services:   . Active Member of Clubs or Organizations:   . Attends Archivist Meetings:   Marland Kitchen Marital Status:   Intimate Partner Violence:   . Fear of Current or Ex-Partner:   . Emotionally Abused:   Marland Kitchen Physically Abused:   . Sexually Abused:     Family History  Problem Relation Age of Onset  . Other Unknown        noncontributory    BP 118/82   Ht 6' (1.829 m)   Wt 180 lb (81.6 kg)   BMI 24.41 kg/m   Review of Systems: See HPI above.     Objective:  Physical Exam:  Gen: NAD, comfortable in exam room  Right knee: No gross deformity, ecchymoses.   Mild effusion. No TTP currently. FROM with 5/5 strength flexion and extension. Negative ant/post drawers. Negative valgus/varus testing. Negative lachmans. Positive mcmurrays, apleys.  Negative patellar apprehension. NV intact distally.   Assessment & Plan:  1.  Right knee pain: Patient's history and exam are suggestive of a meniscus tear with possible flap component.  We discussed it is likely he has some underlying arthropathy as well though would treat conservatively regardless.  He will start with a knee brace for support, Aleve for a week then as needed, Tylenol.  Discussed topical medications, supplements may help.  Can consider 1 cortisone injection if he is struggling.  Shown home exercises to do daily.  Heat or ice.  Follow-up in 1 month.

## 2019-11-19 ENCOUNTER — Ambulatory Visit: Payer: Medicare HMO | Admitting: Family Medicine

## 2020-03-11 ENCOUNTER — Other Ambulatory Visit (HOSPITAL_COMMUNITY): Payer: Self-pay | Admitting: Family Medicine

## 2020-03-11 DIAGNOSIS — E7800 Pure hypercholesterolemia, unspecified: Secondary | ICD-10-CM | POA: Diagnosis not present

## 2020-03-11 MED FILL — SILDENAFIL CITRATE 50 MG TA: 50 | 30 days supply | Qty: 10 | Fill #0

## 2020-04-17 MED FILL — SILDENAFIL CITRATE 50 MG TA: 50 | 30 days supply | Qty: 10 | Fill #1

## 2020-08-20 DIAGNOSIS — E7800 Pure hypercholesterolemia, unspecified: Secondary | ICD-10-CM | POA: Diagnosis not present

## 2020-10-14 ENCOUNTER — Other Ambulatory Visit (HOSPITAL_COMMUNITY): Payer: Self-pay

## 2020-10-14 DIAGNOSIS — L814 Other melanin hyperpigmentation: Secondary | ICD-10-CM | POA: Diagnosis not present

## 2020-10-14 DIAGNOSIS — L718 Other rosacea: Secondary | ICD-10-CM | POA: Diagnosis not present

## 2020-10-14 DIAGNOSIS — L578 Other skin changes due to chronic exposure to nonionizing radiation: Secondary | ICD-10-CM | POA: Diagnosis not present

## 2020-10-14 DIAGNOSIS — D225 Melanocytic nevi of trunk: Secondary | ICD-10-CM | POA: Diagnosis not present

## 2020-10-14 DIAGNOSIS — L218 Other seborrheic dermatitis: Secondary | ICD-10-CM | POA: Diagnosis not present

## 2020-10-14 DIAGNOSIS — L821 Other seborrheic keratosis: Secondary | ICD-10-CM | POA: Diagnosis not present

## 2020-10-14 DIAGNOSIS — D485 Neoplasm of uncertain behavior of skin: Secondary | ICD-10-CM | POA: Diagnosis not present

## 2020-10-14 DIAGNOSIS — C44321 Squamous cell carcinoma of skin of nose: Secondary | ICD-10-CM | POA: Diagnosis not present

## 2020-10-14 MED ORDER — FLUTICASONE PROPIONATE 0.05 % EX CREA
1.0000 "application " | TOPICAL_CREAM | Freq: Two times a day (BID) | CUTANEOUS | 2 refills | Status: DC
  Filled 2020-10-14: qty 30, 20d supply, fill #0

## 2020-10-20 DIAGNOSIS — N528 Other male erectile dysfunction: Secondary | ICD-10-CM | POA: Diagnosis not present

## 2020-10-20 DIAGNOSIS — R972 Elevated prostate specific antigen [PSA]: Secondary | ICD-10-CM | POA: Diagnosis not present

## 2020-10-22 ENCOUNTER — Other Ambulatory Visit (HOSPITAL_COMMUNITY): Payer: Self-pay

## 2020-12-03 ENCOUNTER — Other Ambulatory Visit (HOSPITAL_COMMUNITY): Payer: Self-pay

## 2020-12-03 DIAGNOSIS — E7800 Pure hypercholesterolemia, unspecified: Secondary | ICD-10-CM | POA: Diagnosis not present

## 2020-12-03 DIAGNOSIS — Z0001 Encounter for general adult medical examination with abnormal findings: Secondary | ICD-10-CM | POA: Diagnosis not present

## 2020-12-03 DIAGNOSIS — I10 Essential (primary) hypertension: Secondary | ICD-10-CM | POA: Diagnosis not present

## 2020-12-03 DIAGNOSIS — G4700 Insomnia, unspecified: Secondary | ICD-10-CM | POA: Diagnosis not present

## 2020-12-03 DIAGNOSIS — Z125 Encounter for screening for malignant neoplasm of prostate: Secondary | ICD-10-CM | POA: Diagnosis not present

## 2020-12-03 DIAGNOSIS — N5201 Erectile dysfunction due to arterial insufficiency: Secondary | ICD-10-CM | POA: Diagnosis not present

## 2020-12-03 DIAGNOSIS — Z79899 Other long term (current) drug therapy: Secondary | ICD-10-CM | POA: Diagnosis not present

## 2020-12-03 MED ORDER — AMLODIPINE BESYLATE 5 MG PO TABS
5.0000 mg | ORAL_TABLET | Freq: Every day | ORAL | 6 refills | Status: DC
  Filled 2020-12-03: qty 30, 30d supply, fill #0
  Filled 2021-01-06: qty 30, 30d supply, fill #1
  Filled 2021-02-02: qty 30, 30d supply, fill #2
  Filled 2021-03-05: qty 30, 30d supply, fill #3
  Filled 2021-04-06: qty 30, 30d supply, fill #4
  Filled 2021-05-05: qty 30, 30d supply, fill #5

## 2020-12-03 MED ORDER — ESZOPICLONE 3 MG PO TABS
3.0000 mg | ORAL_TABLET | Freq: Every evening | ORAL | 0 refills | Status: DC | PRN
  Filled 2020-12-03: qty 30, 30d supply, fill #0

## 2020-12-16 ENCOUNTER — Other Ambulatory Visit (HOSPITAL_COMMUNITY): Payer: Self-pay | Admitting: Family Medicine

## 2020-12-23 DIAGNOSIS — D0439 Carcinoma in situ of skin of other parts of face: Secondary | ICD-10-CM | POA: Diagnosis not present

## 2020-12-24 ENCOUNTER — Ambulatory Visit (HOSPITAL_COMMUNITY)
Admission: RE | Admit: 2020-12-24 | Discharge: 2020-12-24 | Disposition: A | Discharge status: Home / Self Care | Payer: Self-pay | Source: Ambulatory Visit | Attending: Family Medicine | Admitting: Family Medicine

## 2020-12-24 ENCOUNTER — Other Ambulatory Visit: Payer: Self-pay

## 2020-12-24 DIAGNOSIS — G4700 Insomnia, unspecified: Secondary | ICD-10-CM | POA: Insufficient documentation

## 2020-12-24 DIAGNOSIS — N400 Enlarged prostate without lower urinary tract symptoms: Secondary | ICD-10-CM | POA: Insufficient documentation

## 2020-12-24 DIAGNOSIS — N5201 Erectile dysfunction due to arterial insufficiency: Secondary | ICD-10-CM | POA: Insufficient documentation

## 2020-12-24 DIAGNOSIS — E7800 Pure hypercholesterolemia, unspecified: Secondary | ICD-10-CM | POA: Insufficient documentation

## 2020-12-24 DIAGNOSIS — I10 Essential (primary) hypertension: Secondary | ICD-10-CM | POA: Insufficient documentation

## 2020-12-29 DIAGNOSIS — Z0001 Encounter for general adult medical examination with abnormal findings: Secondary | ICD-10-CM | POA: Diagnosis not present

## 2020-12-31 ENCOUNTER — Other Ambulatory Visit (HOSPITAL_COMMUNITY): Payer: Self-pay

## 2020-12-31 DIAGNOSIS — E7800 Pure hypercholesterolemia, unspecified: Secondary | ICD-10-CM | POA: Diagnosis not present

## 2020-12-31 DIAGNOSIS — I2510 Atherosclerotic heart disease of native coronary artery without angina pectoris: Secondary | ICD-10-CM | POA: Diagnosis not present

## 2020-12-31 MED ORDER — ROSUVASTATIN CALCIUM 5 MG PO TABS
5.0000 mg | ORAL_TABLET | Freq: Every day | ORAL | 11 refills | Status: DC
  Filled 2020-12-31: qty 30, 30d supply, fill #0
  Filled 2021-02-02: qty 30, 30d supply, fill #1
  Filled 2021-03-05: qty 30, 30d supply, fill #2
  Filled 2021-04-06: qty 30, 30d supply, fill #3

## 2021-01-07 ENCOUNTER — Other Ambulatory Visit (HOSPITAL_COMMUNITY): Payer: Self-pay

## 2021-02-02 ENCOUNTER — Other Ambulatory Visit (HOSPITAL_COMMUNITY): Payer: Self-pay

## 2021-03-05 ENCOUNTER — Other Ambulatory Visit (HOSPITAL_COMMUNITY): Payer: Self-pay

## 2021-04-06 ENCOUNTER — Other Ambulatory Visit (HOSPITAL_COMMUNITY): Payer: Self-pay

## 2021-05-05 ENCOUNTER — Other Ambulatory Visit (HOSPITAL_COMMUNITY): Payer: Self-pay

## 2021-05-05 DIAGNOSIS — L570 Actinic keratosis: Secondary | ICD-10-CM | POA: Diagnosis not present

## 2021-05-05 DIAGNOSIS — D485 Neoplasm of uncertain behavior of skin: Secondary | ICD-10-CM | POA: Diagnosis not present

## 2021-05-05 MED ORDER — ROSUVASTATIN CALCIUM 5 MG PO TABS
5.0000 mg | ORAL_TABLET | Freq: Every day | ORAL | 3 refills | Status: DC
  Filled 2021-05-05: qty 90, 90d supply, fill #0
  Filled 2021-05-07: qty 100, 100d supply, fill #0

## 2021-05-07 ENCOUNTER — Other Ambulatory Visit (HOSPITAL_COMMUNITY): Payer: Self-pay

## 2021-06-11 ENCOUNTER — Inpatient Hospital Stay (HOSPITAL_COMMUNITY)
Admission: EM | Admit: 2021-06-11 | Discharge: 2021-06-17 | DRG: 243 | Disposition: A | Discharge status: Home / Self Care | Payer: Medicare HMO | Source: Ambulatory Visit | Attending: Cardiology | Admitting: Cardiology

## 2021-06-11 ENCOUNTER — Other Ambulatory Visit: Payer: Self-pay

## 2021-06-11 ENCOUNTER — Emergency Department (HOSPITAL_COMMUNITY): Payer: Medicare HMO

## 2021-06-11 ENCOUNTER — Encounter (HOSPITAL_COMMUNITY): Payer: Self-pay

## 2021-06-11 DIAGNOSIS — I429 Cardiomyopathy, unspecified: Secondary | ICD-10-CM | POA: Diagnosis not present

## 2021-06-11 DIAGNOSIS — I200 Unstable angina: Secondary | ICD-10-CM

## 2021-06-11 DIAGNOSIS — I10 Essential (primary) hypertension: Secondary | ICD-10-CM | POA: Diagnosis not present

## 2021-06-11 DIAGNOSIS — I25110 Atherosclerotic heart disease of native coronary artery with unstable angina pectoris: Secondary | ICD-10-CM | POA: Diagnosis present

## 2021-06-11 DIAGNOSIS — I4510 Unspecified right bundle-branch block: Secondary | ICD-10-CM | POA: Diagnosis not present

## 2021-06-11 DIAGNOSIS — I442 Atrioventricular block, complete: Secondary | ICD-10-CM | POA: Diagnosis not present

## 2021-06-11 DIAGNOSIS — G4700 Insomnia, unspecified: Secondary | ICD-10-CM | POA: Diagnosis present

## 2021-06-11 DIAGNOSIS — I441 Atrioventricular block, second degree: Secondary | ICD-10-CM | POA: Diagnosis not present

## 2021-06-11 DIAGNOSIS — R0609 Other forms of dyspnea: Secondary | ICD-10-CM | POA: Diagnosis not present

## 2021-06-11 DIAGNOSIS — I4892 Unspecified atrial flutter: Secondary | ICD-10-CM | POA: Diagnosis present

## 2021-06-11 DIAGNOSIS — I080 Rheumatic disorders of both mitral and aortic valves: Secondary | ICD-10-CM | POA: Diagnosis present

## 2021-06-11 DIAGNOSIS — Z79899 Other long term (current) drug therapy: Secondary | ICD-10-CM

## 2021-06-11 DIAGNOSIS — N529 Male erectile dysfunction, unspecified: Secondary | ICD-10-CM | POA: Diagnosis present

## 2021-06-11 DIAGNOSIS — R42 Dizziness and giddiness: Secondary | ICD-10-CM | POA: Diagnosis not present

## 2021-06-11 DIAGNOSIS — R0602 Shortness of breath: Secondary | ICD-10-CM | POA: Diagnosis not present

## 2021-06-11 DIAGNOSIS — I452 Bifascicular block: Secondary | ICD-10-CM | POA: Diagnosis not present

## 2021-06-11 DIAGNOSIS — I440 Atrioventricular block, first degree: Secondary | ICD-10-CM | POA: Diagnosis not present

## 2021-06-11 DIAGNOSIS — R001 Bradycardia, unspecified: Secondary | ICD-10-CM | POA: Diagnosis present

## 2021-06-11 DIAGNOSIS — I459 Conduction disorder, unspecified: Principal | ICD-10-CM | POA: Diagnosis present

## 2021-06-11 DIAGNOSIS — E785 Hyperlipidemia, unspecified: Secondary | ICD-10-CM | POA: Diagnosis present

## 2021-06-11 DIAGNOSIS — Z9079 Acquired absence of other genital organ(s): Secondary | ICD-10-CM | POA: Diagnosis not present

## 2021-06-11 DIAGNOSIS — I4430 Unspecified atrioventricular block: Secondary | ICD-10-CM | POA: Diagnosis not present

## 2021-06-11 DIAGNOSIS — Z884 Allergy status to anesthetic agent status: Secondary | ICD-10-CM | POA: Diagnosis not present

## 2021-06-11 DIAGNOSIS — Z955 Presence of coronary angioplasty implant and graft: Secondary | ICD-10-CM | POA: Diagnosis not present

## 2021-06-11 DIAGNOSIS — Z87892 Personal history of anaphylaxis: Secondary | ICD-10-CM | POA: Diagnosis not present

## 2021-06-11 DIAGNOSIS — N400 Enlarged prostate without lower urinary tract symptoms: Secondary | ICD-10-CM | POA: Diagnosis not present

## 2021-06-11 DIAGNOSIS — J9811 Atelectasis: Secondary | ICD-10-CM | POA: Diagnosis present

## 2021-06-11 DIAGNOSIS — I517 Cardiomegaly: Secondary | ICD-10-CM | POA: Diagnosis not present

## 2021-06-11 DIAGNOSIS — Z7982 Long term (current) use of aspirin: Secondary | ICD-10-CM

## 2021-06-11 DIAGNOSIS — I2510 Atherosclerotic heart disease of native coronary artery without angina pectoris: Secondary | ICD-10-CM | POA: Diagnosis not present

## 2021-06-11 LAB — CBC
HCT: 43 % (ref 39.0–52.0)
Hemoglobin: 14.7 g/dL (ref 13.0–17.0)
MCH: 30.1 pg (ref 26.0–34.0)
MCHC: 34.2 g/dL (ref 30.0–36.0)
MCV: 88.1 fL (ref 80.0–100.0)
Platelets: 188 10*3/uL (ref 150–400)
RBC: 4.88 MIL/uL (ref 4.22–5.81)
RDW: 12.5 % (ref 11.5–15.5)
WBC: 6.4 10*3/uL (ref 4.0–10.5)
nRBC: 0 % (ref 0.0–0.2)

## 2021-06-11 LAB — BASIC METABOLIC PANEL
Anion gap: 9 (ref 5–15)
BUN: 20 mg/dL (ref 8–23)
CO2: 22 mmol/L (ref 22–32)
Calcium: 8.8 mg/dL — ABNORMAL LOW (ref 8.9–10.3)
Chloride: 106 mmol/L (ref 98–111)
Creatinine, Ser: 1.2 mg/dL (ref 0.61–1.24)
GFR, Estimated: >60 mL/min (ref >60)
Glucose, Bld: 95 mg/dL (ref 70–99)
Potassium: 3.7 mmol/L (ref 3.5–5.1)
Sodium: 137 mmol/L (ref 135–145)

## 2021-06-11 LAB — TSH: TSH: 2.254 u[IU]/mL (ref 0.350–4.500)

## 2021-06-11 LAB — TROPONIN I (HIGH SENSITIVITY)
Troponin I (High Sensitivity): 13 ng/L (ref <18)
Troponin I (High Sensitivity): 16 ng/L (ref <18)

## 2021-06-11 LAB — BRAIN NATRIURETIC PEPTIDE: B Natriuretic Peptide: 299.8 pg/mL — ABNORMAL HIGH (ref 0.0–100.0)

## 2021-06-11 LAB — HIV ANTIBODY (ROUTINE TESTING W REFLEX): HIV Screen 4th Generation wRfx: NONREACTIVE

## 2021-06-11 MED ORDER — SODIUM CHLORIDE 0.9% FLUSH
3.0000 mL | Freq: Two times a day (BID) | INTRAVENOUS | Status: DC
  Administered 2021-06-11 – 2021-06-14 (×4): 3 mL via INTRAVENOUS

## 2021-06-11 MED ORDER — ENOXAPARIN SODIUM 40 MG/0.4ML IJ SOSY
40.0000 mg | PREFILLED_SYRINGE | INTRAMUSCULAR | Status: DC
  Administered 2021-06-12: 40 mg via SUBCUTANEOUS
  Filled 2021-06-11: qty 0.4

## 2021-06-11 NOTE — H&P (Addendum)
Keith Jones is an 71 y.o. male.   ?Chief Complaint: Shortness of breath ?HPI:  ? ?71 y.o. Caucasian male  with hypertension, hyperlipidemia, elevated coronary calcium score, admitted with lightheadedness, dyspnea on exertion, AV block ? ?Patient is retired, stays active with walking.  For last few days, he is experienced dyspnea on exertion, especially while climbing up the hill.  For last 2 days, he is also feeling lightheadedness, but denies syncope.  He was seen by his PCP Dr. Dorthy Cooler today for urgent visit given his symptoms.  His EKG showed bradycardia in 30s.  He was thus sent to emergency room. ? ?At rest, patient is asymptomatic.  Blood pressure is elevated.  Resting heart rate is in 30s.  See below regarding EKG findings. ? ?Of note, patient had an episode of atrial flutter with variable AV conduction in 2011 during a prostatectomy procedure.  He had seen Dr. Haroldine Laws at that time.   His echocardiogram showed mild basal inferior hypokinesis with EF of 45%.  Cardiac MRI performed hypokinesis, but mild diffuse hypokinesis of slightly worse in posterior basal region.  EF was noted to be 51%.  He did have noncritical CAD with calcium score 150 back then.  More recently, CT cardiac scoring showed calcium score of 68th percentile, predominantly in RCA and LAD. ? ?Past Medical History:  ?Diagnosis Date  ? BPH (benign prostatic hypertrophy)   ? Erectile dysfunction   ? H/O seasonal allergies 11-16-12  ? allergy shots past, now no problems  ? Insomnia   ? RBBB (right bundle branch block with left anterior fascicular block)   ? ? ?Past Surgical History:  ?Procedure Laterality Date  ? CYSTOSCOPY N/A 11/24/2012  ? Procedure: CYSTOSCOPY;  Surgeon: Ailene Rud, MD;  Location: [facility];  Service: Urology;  Laterality: N/A;  ? TONSILLECTOMY    ? TRANSURETHRAL RESECTION OF PROSTATE N/A 11/24/2012  ? Procedure: TRANSURETHRAL RESECTION OF THE PROSTATE WITH GYRUS INSTRUMENTS;  Surgeon: Ailene Rud, MD;   Location: [facility];  Service: Urology;  Laterality: N/A;  ? ? ? ?Family History  ?Problem Relation Age of Onset  ? Other Other   ?     noncontributory  ? ? ?Social History:  reports that he has never smoked. He has never used smokeless tobacco. He reports current alcohol use. He reports that he does not use drugs. ? ?Allergies:  ?Allergies  ?Allergen Reactions  ? Other Anaphylaxis  ?  Reports he cardiac arrested after having general anesthesia   ? ? ?ROS ? ? ?Blood pressure (!) 153/76, pulse (!) 38, temperature 99.1 ?F (37.3 ?C), temperature source Oral, resp. rate 16, height 6' (1.829 m), weight 81.6 kg, SpO2 100 %. Body mass index is 24.41 kg/m?. ? ? ?'@PHYSICAMEXAMCV'$ @ ? ?(Not in a hospital admission) ? ? ? ? ?Current Facility-Administered Medications:  ?  [START ON 06/12/2021] enoxaparin (LOVENOX) injection 40 mg, 40 mg, Subcutaneous, Q24H, Sola Margolis J, MD ?  sodium chloride flush (NS) 0.9 % injection 3 mL, 3 mL, Intravenous, Q12H, Azyria Osmon J, MD ? ?Current Outpatient Medications:  ?  amLODipine (NORVASC) 5 MG tablet, Take 1 tablet (5 mg total) by mouth daily., Disp: 30 tablet, Rfl: 6 ?  aspirin EC 81 MG tablet, Take 1 tablet (81 mg total) by mouth daily. Resume once bleeding has stopped., Disp: , Rfl:  ?  docusate sodium (COLACE) 250 MG capsule, Take 1 capsule (250 mg total) by mouth daily., Disp: 30 capsule, Rfl: 0 ?  Eszopiclone (ESZOPICLONE)  3 MG TABS, Take 3 mg by mouth at bedtime. Take immediately before bedtime, Disp: , Rfl:  ?  Eszopiclone 3 MG TABS, TAKE 1 TABLET BY MOUTH IMMEDIATELY BEFORE BEDTIME AS NEEDED, Disp: 30 tablet, Rfl: 1 ?  eszopiclone 3 MG TABS, Take 1 tablet (3 mg total) by mouth immediately before bedtime as needed., Disp: 30 tablet, Rfl: 0 ?  fluticasone (CUTIVATE) 0.05 % cream, Apply topically 2 (two) times daily as needed., Disp: , Rfl:  ?  fluticasone (CUTIVATE) 0.05 % cream, Apply 1 application topically 2 (two) times daily to affected area on face. Use 1 week on and 1  week off, then repeat as needed, Disp: 30 g, Rfl: 2 ?  PICATO 0.015 % GEL, Apply 1 application topically daily., Disp: , Rfl:  ?  rosuvastatin (CRESTOR) 5 MG tablet, Take 1 tablet (5 mg total) by mouth daily., Disp: 30 tablet, Rfl: 11 ?  rosuvastatin (CRESTOR) 5 MG tablet, Take 1 tablet (5 mg total) by mouth daily., Disp: 100 tablet, Rfl: 3 ?  sildenafil (VIAGRA) 50 MG tablet, , Disp: , Rfl:  ?  sildenafil (VIAGRA) 50 MG tablet, TAKE 1 TABLET BY MOUTH ONCE A DAY AS NEEDED, Disp: 10 tablet, Rfl: 6 ?  sodium chloride (OCEAN) 0.65 % SOLN nasal spray, Place 1 spray into both nostrils as needed for congestion., Disp: 60 mL, Rfl: 0 ? ? ?Today's Vitals  ? 06/11/21 1812 06/11/21 1815  ?BP: (!) 153/76   ?Pulse: (!) 38   ?Resp: 16   ?Temp: 99.1 ?F (37.3 ?C)   ?TempSrc: Oral   ?SpO2: 100%   ?Weight:  81.6 kg  ?Height:  6' (1.829 m)  ?PainSc:  0-No pain  ? ?Body mass index is 24.41 kg/m?. ? ? ? ? ?Lab Results: ?Reviewed and interpreted: ?'@LABSECTIONRESULTS'$ @ ? ? ? ?Tests ordered: ? ?Lab Orders    ?     Basic metabolic panel    ?     CBC    ?     Brain natriuretic peptide    ?     TSH    ?     HIV Antibody (routine testing w rflx)    ?     Creatinine, serum    ? ? ? ?Cardiac Studies: ? ? ?Imaging/tests reviewed and independently interpreted: ? ?Telemetry 06/11/2021: ?Sinus rhythm w/2:1 AV block ?Ventricular rate in 40s ? ?EKG 06/11/2021: ?Sinus rhythm w/2: 1 AV block ?Anterolateral T wave inversion, consider ischemia ? ?CT cardiac scoring 12/24/2020: ?LM: 0 ?LAD: 55 ?Lcx: 0 ?RCA: 341 ?Total: 341 (68th percentile) ? ?CCTA 2011: ?1)    Calcium score 150  ?2)    No critical CAD.  < 30% calcified plaque in small OM2.  Less  ?than 50% calcified plaque in proximal RCA.  ? ?CMRI 2011: ?Findings:  ? ?There was mild LVE with mild diffuse hypokinesis slightly worse in  ?the posterior basal region.  LA/RA and RV were normal.  No ASD or  ?VSD.  No pericardial effusion. Aortic and mitral valves  ?structurally normal  ? ?EDV 170cc  ?ESV 83 cc   ?SV: 87 cc  ? ?EF: 51%  ? ?Impression: Mild LVE with low normal EF 51%  ?Findings:  ? ?There was mild LVE with mild diffuse hypokinesis slightly worse in  ?the posterior basal region.  LA/RA and RV were normal.  No ASD or  ?VSD.  No pericardial effusion. Aortic and mitral valves  ?structurally normal  ? ?EDV 170cc  ?ESV 83 cc  ?  SV: 87 cc  ? ?EF: 51%  ? ?Impression: Mild LVE with low normal EF 51%  ? ?Echocardiogram 2011: ?- Left ventricle: The cavity size was at the upper limits of normal.  ?   Systolic function was mildly reduced. The estimated ejection  ?   fraction was in the range of 45% to 50%. There is hypokinesis of  ?   the mid-distal lateral and inferolateral myocardium. Doppler  ?   parameters are consistent with abnormal left ventricular  ?   relaxation (grade 1 diastolic dysfunction).  ? - Left atrium: The atrium was moderately dilated.  ? ? ?Assessment & Recommendations: ? ?71 y.o. Caucasian male  with hypertension, hyperlipidemia, elevated coronary calcium score, admitted with lightheadedness, dyspnea on exertion, AV block ? ?AV block: ?Symptomatic 2:1 AV block with ventricular rate in 30s, associated with lightheadedness and exertional dyspnea. ?Hemodynamically stable with no acute indication for temporary pacemaker. ?Known CAD with calcium score in 68th percentile, predominantly in LAD and RCA. ?EKG with anterolateral ischemia. ?Need to exclude obstructive CAD as etiology of AV block before discussing possible permanent pacemaker ?Check HS trop, BNP ?Will consider coronary angiography. ?Check TSH. ?Hold amlodipine for now. Okay to have permissive hypertension. ?Continue Crestor, will add Aspirin 81 mg.  ? ? ? ?Nigel Mormon, MD ?Pager: [phone] ?Office: [phone] ? ? ?  ? ?

## 2021-06-11 NOTE — ED Triage Notes (Signed)
Pt reports he has been feeling dizzy and SOB for about 2 days and went to his doctors office; EKG was done and his doc told him to come straight to ED for a second degree heart block with a rate in the 30s. He states is dizziness is intermittent, states he feels "off." Denies CP. ?

## 2021-06-11 NOTE — ED Provider Notes (Signed)
?Keith Jones ?Provider Note ? ? ?CSN: [id number] ?Arrival date & time: 06/11/21  1806 ? ?  ? ?History ? ?Chief Complaint  ?Patient presents with  ? Abnormal ECG  ? ? ?Keith Jones is a 71 y.o. male with past medical history of hypertension and hyperlipidemia presenting to the ED for low heart rate.  Patient was seen by his primary care provider for having lightheaded spells and dizziness for the last couple of days.  He was found there to have concerning findings for second-degree heart block.  Patient was then transferred here for further evaluation.  Patient is asymptomatic at this time.  He denies any headache, fever, chills, cough, congestion, runny nose, chest pain, shortness of breath, or bilateral extremity swelling. ? ?Reports a poor reaction to general anesthesia in the past when he had prostatectomy for his BPH.  Patient takes amlodipine and a statin.  Denies any beta-blockers. ? ?HPI ? ?  ? ?Home Medications ?Prior to Admission medications   ?Medication Sig Start Date End Date Taking? Authorizing Provider  ?amLODipine (NORVASC) 5 MG tablet Take 1 tablet (5 mg total) by mouth daily. 12/03/20     ?aspirin EC 81 MG tablet Take 1 tablet (81 mg total) by mouth daily. Resume once bleeding has stopped. 11/25/12   Ardis Hughs, MD  ?docusate sodium (COLACE) 250 MG capsule Take 1 capsule (250 mg total) by mouth daily. 11/25/12   Ardis Hughs, MD  ?Eszopiclone (ESZOPICLONE) 3 MG TABS Take 3 mg by mouth at bedtime. Take immediately before bedtime    [provider]  ?Eszopiclone 3 MG TABS TAKE 1 TABLET BY MOUTH IMMEDIATELY BEFORE BEDTIME AS NEEDED 03/11/20 09/07/20  Koirala, Dibas, MD  ?eszopiclone 3 MG TABS Take 1 tablet (3 mg total) by mouth immediately before bedtime as needed. 12/03/20     ?fluticasone (CUTIVATE) 0.05 % cream Apply topically 2 (two) times daily as needed. 10/15/19   [provider]  ?fluticasone (CUTIVATE) 0.05 % cream Apply 1  application topically 2 (two) times daily to affected area on face. Use 1 week on and 1 week off, then repeat as needed 10/14/20     ?PICATO 0.015 % GEL Apply 1 application topically daily. 10/15/19   [provider]  ?rosuvastatin (CRESTOR) 5 MG tablet Take 1 tablet (5 mg total) by mouth daily. 12/31/20     ?rosuvastatin (CRESTOR) 5 MG tablet Take 1 tablet (5 mg total) by mouth daily. 05/05/21     ?sildenafil (VIAGRA) 50 MG tablet  05/06/18   [provider]  ?sildenafil (VIAGRA) 50 MG tablet TAKE 1 TABLET BY MOUTH ONCE A DAY AS NEEDED 09/06/19 09/05/20  Koirala, Dibas, MD  ?sodium chloride (OCEAN) 0.65 % SOLN nasal spray Place 1 spray into both nostrils as needed for congestion. 08/22/17   Zigmund Gottron, NP  ?   ? ?Allergies    ?Other   ? ?Review of Systems   ?Review of Systems  ?Constitutional:  Negative for chills and fever.  ?Respiratory:  Negative for chest tightness and shortness of breath.   ?Cardiovascular:  Negative for chest pain and palpitations.  ? ?Physical Exam ?Updated Vital Signs ?BP (!) 153/76 (BP Location: Left Arm)   Pulse (!) 38   Temp 99.1 ?F (37.3 ?C) (Oral)   Resp 16   Ht 6' (1.829 m)   Wt 81.6 kg   SpO2 100%   BMI 24.41 kg/m?  ?Physical Exam ?Vitals and nursing note reviewed.  ?  HENT:  ?   Head: Normocephalic and atraumatic.  ?Cardiovascular:  ?   Rate and Rhythm: Bradycardia present.  ?   Pulses: Normal pulses.  ?Pulmonary:  ?   Effort: Pulmonary effort is normal. No respiratory distress.  ?   Breath sounds: Normal breath sounds.  ?Musculoskeletal:  ?   Right lower leg: No edema.  ?   Left lower leg: No edema.  ?Neurological:  ?   Mental Status: He is oriented to person, place, and time.  ?Psychiatric:     ?   Mood and Affect: Mood normal.     ?   Behavior: Behavior normal.  ? ? ?ED Results / Procedures / Treatments   ?Labs ?(all labs ordered are listed, but only abnormal results are displayed) ?Labs Reviewed  ?BASIC METABOLIC PANEL - Abnormal; Notable for the following  components:  ?    Result Value  ? Calcium 8.8 (*)   ? All other components within normal limits  ?BRAIN NATRIURETIC PEPTIDE - Abnormal; Notable for the following components:  ? B Natriuretic Peptide 299.8 (*)   ? All other components within normal limits  ?CBC  ?TSH  ?HIV ANTIBODY (ROUTINE TESTING W REFLEX)  ?TROPONIN I (HIGH SENSITIVITY)  ?TROPONIN I (HIGH SENSITIVITY)  ? ? ?EKG ?EKG Interpretation ? ?Date/Time:  Thursday June 11 2021 19:02:19 EDT ?Ventricular Rate:  39 ?PR Interval:  217 ?QRS Duration: 179 ?QT Interval:  683 ?QTC Calculation: 551 ?R Axis:   95 ?Text Interpretation: Sinus bradycardia RBBB and LPFB Abnrm T, consider ischemia, anterolateral lds Confirmed by Keith Jones [phone]) on 06/11/2021 7:37:56 PM ? ?Radiology ?DG Chest Portable 1 View ? ?Result Date: 06/11/2021 ?CLINICAL DATA:  Shortness of breath. Dizziness. Second degree heart block EKG. EXAM: PORTABLE CHEST 1 VIEW COMPARISON:  Included portion from coronary CT 12/24/2020 FINDINGS: The heart is mildly enlarged. Normal mediastinal contours. Scattered subsegmental atelectasis in the lung bases without confluent airspace disease. No pulmonary edema, pleural effusion, or pneumothorax. No acute osseous findings. IMPRESSION: Mild cardiomegaly with scattered subsegmental atelectasis in the lung bases. Electronically Signed   By: Keith Jones M.D.   On: 06/11/2021 19:44   ? ?Procedures ?Procedures  ? ? ?Medications Ordered in ED ?Medications  ?sodium chloride flush (NS) 0.9 % injection 3 mL (has no administration in time range)  ?enoxaparin (LOVENOX) injection 40 mg (has no administration in time range)  ? ? ?ED Course/ Medical Decision Making/ A&P ?Clinical Course as of 06/11/21 2120  ?Thu Jun 11, 2021  ?1853 Hold amlodipine, Telemetry floor. Probably needs pacemaker. Cardiology to see tomorrow. No high grade block that indicates emergent pacemaker per cardiology at this time. Needs observation given symptoms. Admit to hospitalist on progressive,   [RK]  ?  ?Clinical Course User Index ?[RK] Lupita Dawn, MD  ? ?                        ?Medical Decision Making ?Amount and/or Complexity of Data Reviewed ?Labs: ordered. ?Radiology: ordered. ? ?Risk ?Decision regarding hospitalization. ? ? ?On exam, bradycardic in the 40s and hemodynamically stable.   EKG concerning for secondary heart block.  Reviewed outside records and no cardiology appointments recently.  History per chart review also includes a right bundle branch block, ED, and insomnia.  Additional collateral obtained from wife at bedside. ? ? Consulted cardiology unassigned who recommends admission for monitoring to ensure no high-grade heart block and consider a pacemaker placement due to symptoms previously.  Cardiology  recommends holding the amlodipine and having admitted to a telemetry floor on their service.   ? ?Labs remarkable for BNP of 299.  Normal TSH, normal troponin.  Patient admitted to cardiology and care transferred to their service.  Patient and wife verbalized agreement and understanding with plan. ? ?Patient seen in conjunction with my attending Dr. Laverta Baltimore. ? ? ?Final Clinical Impression(s) / ED Diagnoses ?Final diagnoses:  ?Heart block  ?Bradycardia  ? ? ?Rx / DC Orders ?ED Discharge Orders   ? ? None  ? ?  ? ? ?  ?Lupita Dawn, MD ?06/11/21 2124 ? ?  ?Margette Fast, MD ?06/12/21 1253 ? ?

## 2021-06-12 ENCOUNTER — Inpatient Hospital Stay (HOSPITAL_COMMUNITY): Payer: Medicare HMO

## 2021-06-12 ENCOUNTER — Encounter (HOSPITAL_COMMUNITY): Payer: Self-pay | Admitting: Cardiology

## 2021-06-12 ENCOUNTER — Other Ambulatory Visit (HOSPITAL_COMMUNITY): Payer: Self-pay

## 2021-06-12 ENCOUNTER — Encounter (HOSPITAL_COMMUNITY)
Admission: EM | Disposition: A | Discharge status: Home / Self Care | Payer: Self-pay | Source: Ambulatory Visit | Attending: Cardiology

## 2021-06-12 DIAGNOSIS — R001 Bradycardia, unspecified: Secondary | ICD-10-CM

## 2021-06-12 DIAGNOSIS — I459 Conduction disorder, unspecified: Secondary | ICD-10-CM

## 2021-06-12 DIAGNOSIS — I200 Unstable angina: Secondary | ICD-10-CM

## 2021-06-12 DIAGNOSIS — I4430 Unspecified atrioventricular block: Secondary | ICD-10-CM

## 2021-06-12 HISTORY — PX: LEFT HEART CATH AND CORONARY ANGIOGRAPHY: CATH118249

## 2021-06-12 HISTORY — PX: CORONARY STENT INTERVENTION: CATH118234

## 2021-06-12 LAB — LIPID PANEL
Cholesterol: 135 mg/dL (ref 0–200)
HDL: 53 mg/dL (ref >40)
LDL Cholesterol: 71 mg/dL (ref 0–99)
Total CHOL/HDL Ratio: 2.5 RATIO
Triglycerides: 57 mg/dL (ref <150)
VLDL: 11 mg/dL (ref 0–40)

## 2021-06-12 LAB — ECHOCARDIOGRAM COMPLETE
Area-P 1/2: 4.96 cm2
Height: 72 in
P 1/2 time: 1049 msec
S' Lateral: 4.4 cm
Single Plane A4C EF: 43.6 %
Weight: 3051.17 oz

## 2021-06-12 LAB — GLUCOSE, CAPILLARY: Glucose-Capillary: 99 mg/dL (ref 70–99)

## 2021-06-12 LAB — HEMOGLOBIN A1C
Hgb A1c MFr Bld: 5.3 % (ref 4.8–5.6)
Mean Plasma Glucose: 105.41 mg/dL

## 2021-06-12 SURGERY — LEFT HEART CATH AND CORONARY ANGIOGRAPHY
Anesthesia: LOCAL

## 2021-06-12 MED ORDER — VERAPAMIL HCL 2.5 MG/ML IV SOLN
INTRAVENOUS | Status: AC
  Filled 2021-06-12: qty 2

## 2021-06-12 MED ORDER — NITROGLYCERIN 1 MG/10 ML FOR IR/CATH LAB
INTRA_ARTERIAL | Status: DC | PRN
Start: 2021-06-12 — End: 2021-06-12
  Administered 2021-06-12: 300 ug via INTRACORONARY

## 2021-06-12 MED ORDER — NITROGLYCERIN 1 MG/10 ML FOR IR/CATH LAB
INTRA_ARTERIAL | Status: AC
  Filled 2021-06-12: qty 10

## 2021-06-12 MED ORDER — ROSUVASTATIN CALCIUM 5 MG PO TABS: 5.0000 mg | ORAL_TABLET | Freq: Every day | ORAL | Status: DC

## 2021-06-12 MED ORDER — ROSUVASTATIN CALCIUM 20 MG PO TABS
20.0000 mg | ORAL_TABLET | Freq: Every day | ORAL | Status: DC
  Administered 2021-06-12 – 2021-06-17 (×6): 20 mg via ORAL
  Filled 2021-06-12 (×6): qty 1

## 2021-06-12 MED ORDER — HYDRALAZINE HCL 20 MG/ML IJ SOLN: 10.0000 mg | INTRAMUSCULAR | Status: AC | PRN

## 2021-06-12 MED ORDER — HYDRALAZINE HCL 20 MG/ML IJ SOLN
INTRAMUSCULAR | Status: AC
  Filled 2021-06-12: qty 1

## 2021-06-12 MED ORDER — SODIUM CHLORIDE 0.9% FLUSH: 3.0000 mL | INTRAVENOUS | Status: DC | PRN

## 2021-06-12 MED ORDER — ROSUVASTATIN CALCIUM 20 MG PO TABS
20.0000 mg | ORAL_TABLET | Freq: Every day | ORAL | 1 refills | Status: DC
  Filled 2021-06-12: qty 30, 30d supply, fill #0

## 2021-06-12 MED ORDER — ASPIRIN 81 MG PO TBEC
81.0000 mg | DELAYED_RELEASE_TABLET | Freq: Every day | ORAL | 1 refills | Status: DC
  Filled 2021-06-12: qty 30, 30d supply, fill #0

## 2021-06-12 MED ORDER — CLOPIDOGREL BISULFATE 300 MG PO TABS
ORAL_TABLET | ORAL | Status: AC
  Filled 2021-06-12: qty 2

## 2021-06-12 MED ORDER — ACETAMINOPHEN 325 MG PO TABS: 650.0000 mg | ORAL_TABLET | ORAL | Status: DC | PRN

## 2021-06-12 MED ORDER — CLOPIDOGREL BISULFATE 300 MG PO TABS
ORAL_TABLET | ORAL | Status: DC | PRN
  Administered 2021-06-12: 600 mg via ORAL

## 2021-06-12 MED ORDER — HEPARIN SODIUM (PORCINE) 1000 UNIT/ML IJ SOLN
INTRAMUSCULAR | Status: AC
  Filled 2021-06-12: qty 10

## 2021-06-12 MED ORDER — LORAZEPAM 2 MG/ML IJ SOLN: 1.0000 mg | INTRAMUSCULAR | Status: AC | PRN

## 2021-06-12 MED ORDER — NITROGLYCERIN 0.4 MG SL SUBL
0.4000 mg | SUBLINGUAL_TABLET | SUBLINGUAL | 1 refills | Status: DC | PRN
  Filled 2021-06-12: qty 25, 14d supply, fill #0

## 2021-06-12 MED ORDER — SODIUM CHLORIDE 0.9 % IV SOLN
250.0000 mL | INTRAVENOUS | Status: DC | PRN
  Administered 2021-06-12: 250 mL via INTRAVENOUS

## 2021-06-12 MED ORDER — ONDANSETRON HCL 4 MG/2ML IJ SOLN: 4.0000 mg | Freq: Four times a day (QID) | INTRAMUSCULAR | Status: DC | PRN

## 2021-06-12 MED ORDER — ASPIRIN EC 81 MG PO TBEC
81.0000 mg | DELAYED_RELEASE_TABLET | Freq: Every day | ORAL | Status: DC
  Administered 2021-06-13 – 2021-06-17 (×4): 81 mg via ORAL
  Filled 2021-06-12 (×4): qty 1

## 2021-06-12 MED ORDER — LORAZEPAM 1 MG PO TABS: 1.0000 mg | ORAL_TABLET | ORAL | Status: AC | PRN

## 2021-06-12 MED ORDER — FAMOTIDINE IN NACL 20-0.9 MG/50ML-% IV SOLN
INTRAVENOUS | Status: AC | PRN
  Administered 2021-06-12: 20 mg via INTRAVENOUS

## 2021-06-12 MED ORDER — AMLODIPINE BESYLATE 5 MG PO TABS: 5.0000 mg | ORAL_TABLET | Freq: Every day | ORAL | Status: DC

## 2021-06-12 MED ORDER — FAMOTIDINE IN NACL 20-0.9 MG/50ML-% IV SOLN
INTRAVENOUS | Status: AC
  Filled 2021-06-12: qty 50

## 2021-06-12 MED ORDER — CLOPIDOGREL BISULFATE 75 MG PO TABS
75.0000 mg | ORAL_TABLET | Freq: Every day | ORAL | Status: DC
  Administered 2021-06-13 – 2021-06-17 (×5): 75 mg via ORAL
  Filled 2021-06-12 (×5): qty 1

## 2021-06-12 MED ORDER — LIDOCAINE HCL (PF) 1 % IJ SOLN
INTRAMUSCULAR | Status: AC
  Filled 2021-06-12: qty 30

## 2021-06-12 MED ORDER — ASPIRIN 81 MG PO CHEW
81.0000 mg | CHEWABLE_TABLET | ORAL | Status: AC
  Administered 2021-06-12: 81 mg via ORAL
  Filled 2021-06-12: qty 1

## 2021-06-12 MED ORDER — THIAMINE HCL 100 MG PO TABS
100.0000 mg | ORAL_TABLET | Freq: Every day | ORAL | Status: DC
Start: 2021-06-12 — End: 2021-06-17
  Administered 2021-06-12 – 2021-06-17 (×6): 100 mg via ORAL
  Filled 2021-06-12 (×6): qty 1

## 2021-06-12 MED ORDER — HYDRALAZINE HCL 20 MG/ML IJ SOLN
INTRAMUSCULAR | Status: DC | PRN
Start: 2021-06-12 — End: 2021-06-12
  Administered 2021-06-12: 10 mg via INTRAVENOUS

## 2021-06-12 MED ORDER — SODIUM CHLORIDE 0.9% FLUSH: 3.0000 mL | Freq: Two times a day (BID) | INTRAVENOUS | Status: DC

## 2021-06-12 MED ORDER — SODIUM CHLORIDE 0.9% FLUSH
3.0000 mL | Freq: Two times a day (BID) | INTRAVENOUS | Status: DC
  Administered 2021-06-13: 3 mL via INTRAVENOUS

## 2021-06-12 MED ORDER — LABETALOL HCL 5 MG/ML IV SOLN: 10.0000 mg | INTRAVENOUS | Status: AC | PRN

## 2021-06-12 MED ORDER — MIDAZOLAM HCL 2 MG/2ML IJ SOLN
INTRAMUSCULAR | Status: AC
  Filled 2021-06-12: qty 2

## 2021-06-12 MED ORDER — MIDAZOLAM HCL 2 MG/2ML IJ SOLN
INTRAMUSCULAR | Status: DC | PRN
  Administered 2021-06-12: 1 mg via INTRAVENOUS

## 2021-06-12 MED ORDER — ADULT MULTIVITAMIN W/MINERALS CH
1.0000 | ORAL_TABLET | Freq: Every day | ORAL | Status: DC
  Administered 2021-06-12 – 2021-06-17 (×6): 1 via ORAL
  Filled 2021-06-12 (×6): qty 1

## 2021-06-12 MED ORDER — SODIUM CHLORIDE 0.9 % IV SOLN
250.0000 mL | INTRAVENOUS | Status: DC | PRN
Start: 2021-06-12 — End: 2021-06-17

## 2021-06-12 MED ORDER — SODIUM CHLORIDE 0.9 % IV SOLN: INTRAVENOUS | Status: DC

## 2021-06-12 MED ORDER — HEPARIN SODIUM (PORCINE) 1000 UNIT/ML IJ SOLN
INTRAMUSCULAR | Status: DC | PRN
  Administered 2021-06-12 (×2): 4500 [IU] via INTRAVENOUS

## 2021-06-12 MED ORDER — HEPARIN (PORCINE) IN NACL 1000-0.9 UT/500ML-% IV SOLN
INTRAVENOUS | Status: DC | PRN
  Administered 2021-06-12 (×3): 500 mL

## 2021-06-12 MED ORDER — VERAPAMIL HCL 2.5 MG/ML IV SOLN
INTRAVENOUS | Status: DC | PRN
  Administered 2021-06-12: 5 mL via INTRA_ARTERIAL

## 2021-06-12 MED ORDER — CLOPIDOGREL BISULFATE 300 MG PO TABS
ORAL_TABLET | ORAL | Status: AC
  Filled 2021-06-12: qty 1

## 2021-06-12 MED ORDER — FENTANYL CITRATE (PF) 100 MCG/2ML IJ SOLN
INTRAMUSCULAR | Status: AC
  Filled 2021-06-12: qty 2

## 2021-06-12 MED ORDER — FOLIC ACID 1 MG PO TABS
1.0000 mg | ORAL_TABLET | Freq: Every day | ORAL | Status: DC
Start: 2021-06-12 — End: 2021-06-17
  Administered 2021-06-12 – 2021-06-17 (×6): 1 mg via ORAL
  Filled 2021-06-12 (×6): qty 1

## 2021-06-12 MED ORDER — LIDOCAINE HCL (PF) 1 % IJ SOLN
INTRAMUSCULAR | Status: DC | PRN
Start: 2021-06-12 — End: 2021-06-12
  Administered 2021-06-12: 2 mL via INTRADERMAL

## 2021-06-12 MED ORDER — IOHEXOL 350 MG/ML SOLN
INTRAVENOUS | Status: DC | PRN
  Administered 2021-06-12: 100 mL

## 2021-06-12 MED ORDER — DOCUSATE SODIUM 100 MG PO CAPS
200.0000 mg | ORAL_CAPSULE | Freq: Every day | ORAL | Status: DC
  Administered 2021-06-13 – 2021-06-17 (×5): 200 mg via ORAL
  Filled 2021-06-12 (×5): qty 2

## 2021-06-12 MED ORDER — HEPARIN SODIUM (PORCINE) 5000 UNIT/ML IJ SOLN
5000.0000 [IU] | Freq: Three times a day (TID) | INTRAMUSCULAR | Status: DC
  Administered 2021-06-12 – 2021-06-14 (×5): 5000 [IU] via SUBCUTANEOUS
  Filled 2021-06-12 (×5): qty 1

## 2021-06-12 MED ORDER — THIAMINE HCL 100 MG/ML IJ SOLN: 100.0000 mg | Freq: Every day | INTRAMUSCULAR | Status: DC

## 2021-06-12 MED ORDER — HEPARIN (PORCINE) IN NACL 1000-0.9 UT/500ML-% IV SOLN
INTRAVENOUS | Status: AC
  Filled 2021-06-12: qty 1500

## 2021-06-12 MED ORDER — FENTANYL CITRATE (PF) 100 MCG/2ML IJ SOLN
INTRAMUSCULAR | Status: DC | PRN
  Administered 2021-06-12: 25 ug via INTRAVENOUS

## 2021-06-12 MED ORDER — CLOPIDOGREL BISULFATE 75 MG PO TABS
75.0000 mg | ORAL_TABLET | Freq: Every day | ORAL | 1 refills | Status: DC
  Filled 2021-06-12: qty 30, 30d supply, fill #0

## 2021-06-12 MED ORDER — SODIUM CHLORIDE 0.9 % IV SOLN: INTRAVENOUS | Status: AC

## 2021-06-12 MED ORDER — ZOLPIDEM TARTRATE 5 MG PO TABS: 5.0000 mg | ORAL_TABLET | Freq: Every evening | ORAL | Status: DC | PRN

## 2021-06-12 SURGICAL SUPPLY — 20 items
BALL SAPPHIRE NC24 4.0X12 (BALLOONS) ×2
BALLN SAPPHIRE 4.0X15 (BALLOONS) ×2
BALLOON SAPPHIRE 4.0X15 (BALLOONS) IMPLANT
BALLOON SAPPHIRE NC24 4.0X12 (BALLOONS) IMPLANT
CATH OPTITORQUE TIG 4.0 5F (CATHETERS) ×1 IMPLANT
CATH VISTA GUIDE 6FR JR4 (CATHETERS) ×1 IMPLANT
DEVICE RAD COMP TR BAND LRG (VASCULAR PRODUCTS) ×1 IMPLANT
ELECT DEFIB PAD ADLT CADENCE (PAD) ×1 IMPLANT
GLIDESHEATH SLEND A-KIT 6F 22G (SHEATH) ×1 IMPLANT
GUIDEWIRE INQWIRE 1.5J.035X260 (WIRE) IMPLANT
INQWIRE 1.5J .035X260CM (WIRE) ×2
KIT ENCORE 26 ADVANTAGE (KITS) ×1 IMPLANT
KIT HEART LEFT (KITS) ×2 IMPLANT
KIT HEMO VALVE WATCHDOG (MISCELLANEOUS) ×1 IMPLANT
PACK CARDIAC CATHETERIZATION (CUSTOM PROCEDURE TRAY) ×2 IMPLANT
STENT ONYX FRONTIER 4.0X15 (Permanent Stent) ×1 IMPLANT
STENT ONYX FRONTIER 4.0X18 (Permanent Stent) ×1 IMPLANT
TRANSDUCER W/STOPCOCK (MISCELLANEOUS) ×2 IMPLANT
TUBING CIL FLEX 10 FLL-RA (TUBING) ×2 IMPLANT
WIRE ASAHI PROWATER 180CM (WIRE) ×1 IMPLANT

## 2021-06-12 NOTE — Consult Note (Addendum)
? ?ELECTROPHYSIOLOGY CONSULT NOTE  ? ? ?Patient ID: Keith Jones ?MRN: [id number], DOB/AGE: 06/20/1950 71 y.o. ? ?Admit date: 06/11/2021 ?Date of Consult: 06/12/2021 ? ?Primary Physician: Lujean Amel, MD ?Primary Cardiologist: New to Dr. Virgina Jock, Previously saw Dr. Johnsie Cancel years ago ?Electrophysiologist:  New ? ?Referring Provider: Dr. Virgina Jock ? ?Patient Profile: ?Keith Jones is a 71 y.o. male with a history of non-obstructive CAD by CT scoring and HLD who is being seen today for the evaluation of symptomatic 2:1 AV block at the request of Dr. Virgina Jock. ? ?HPI:  ?Keith Jones is a 71 y.o. male with medical history as above.  ? ?Pt previously had a reported episode of atrial flutter with variable AV conduction in 2011 during a prostatectomy procedure.  Seen at that time and Echo showed mild basal inferior hypokinesis with EF of 45%. cMRI performed showed HK, but mild HK of slightly worse in posterior basal region with EF 51%. He did have noncritical CAD with calcium score 150 back then.  More recently, CT cardiac scoring showed calcium score of 68th percentile, predominantly in RCA and LAD. ? ?Pt has had dyspnea on exertion and fatigue over the past several days.  Denies frank syncope or chest pain. Saw his PCP for urgent visit yesterday and sent to ED with EKG showing bradycardia in the 30s.  ? ?Pertinent labs on admission include negative HS trop x 2, BNP 299, K 3.7, Cr 1.2, WBC 6.4, Hgb 14.7, TSH 2.254 ? ?CXR with mild scattered atelectasis.  ? ?This am, he is lying in bed in NAD with his wife on the phone. Plan for echo and then cath this am. Potentially pacer after if cath unremarkable.  ? ?Past Medical History:  ?Diagnosis Date  ? BPH (benign prostatic hypertrophy)   ? Erectile dysfunction   ? H/O seasonal allergies 11-16-12  ? allergy shots past, now no problems  ? Insomnia   ? RBBB (right bundle branch block with left anterior fascicular block)   ?  ? ?Surgical History:  ?Past Surgical History:   ?Procedure Laterality Date  ? CYSTOSCOPY N/A 11/24/2012  ? Procedure: CYSTOSCOPY;  Surgeon: Ailene Rud, MD;  Location: [facility];  Service: Urology;  Laterality: N/A;  ? TONSILLECTOMY    ? TRANSURETHRAL RESECTION OF PROSTATE N/A 11/24/2012  ? Procedure: TRANSURETHRAL RESECTION OF THE PROSTATE WITH GYRUS INSTRUMENTS;  Surgeon: Ailene Rud, MD;  Location: [facility];  Service: Urology;  Laterality: N/A;  ?  ? ?Medications Prior to Admission  ?Medication Sig Dispense Refill Last Dose  ? amLODipine (NORVASC) 5 MG tablet Take 1 tablet (5 mg total) by mouth daily. 30 tablet 6   ? aspirin EC 81 MG tablet Take 1 tablet (81 mg total) by mouth daily. Resume once bleeding has stopped.     ? docusate sodium (COLACE) 250 MG capsule Take 1 capsule (250 mg total) by mouth daily. 30 capsule 0   ? Eszopiclone (ESZOPICLONE) 3 MG TABS Take 3 mg by mouth at bedtime. Take immediately before bedtime     ? Eszopiclone 3 MG TABS TAKE 1 TABLET BY MOUTH IMMEDIATELY BEFORE BEDTIME AS NEEDED 30 tablet 1   ? eszopiclone 3 MG TABS Take 1 tablet (3 mg total) by mouth immediately before bedtime as needed. 30 tablet 0   ? fluticasone (CUTIVATE) 0.05 % cream Apply topically 2 (two) times daily as needed.     ? fluticasone (CUTIVATE) 0.05 % cream Apply 1 application topically 2 (two) times daily to  affected area on face. Use 1 week on and 1 week off, then repeat as needed 30 g 2   ? PICATO 0.015 % GEL Apply 1 application topically daily.     ? rosuvastatin (CRESTOR) 5 MG tablet Take 1 tablet (5 mg total) by mouth daily. 30 tablet 11   ? rosuvastatin (CRESTOR) 5 MG tablet Take 1 tablet (5 mg total) by mouth daily. 100 tablet 3   ? sildenafil (VIAGRA) 50 MG tablet      ? sildenafil (VIAGRA) 50 MG tablet TAKE 1 TABLET BY MOUTH ONCE A DAY AS NEEDED 10 tablet 6   ? sodium chloride (OCEAN) 0.65 % SOLN nasal spray Place 1 spray into both nostrils as needed for congestion. 60 mL 0   ? ? ?Inpatient Medications:  ? enoxaparin (LOVENOX) injection  40  mg Subcutaneous Q24H  ? sodium chloride flush  3 mL Intravenous Q12H  ? sodium chloride flush  3 mL Intravenous Q12H  ? ? ?Allergies:  ?Allergies  ?Allergen Reactions  ? Other Anaphylaxis  ?  Reports he cardiac arrested after having general anesthesia   ? ? ?Social History  ? ?Socioeconomic History  ? Marital status: Married  ?  Spouse name: Not on file  ? Number of children: Not on file  ? Years of education: Not on file  ? Highest education level: Not on file  ?Occupational History  ? Occupation: sedentary  ?  Employer: RETIRED  ?Tobacco Use  ? Smoking status: Never  ? Smokeless tobacco: Never  ?Substance and Sexual Activity  ? Alcohol use: Yes  ?  Comment: rare occ.- wine/beer  ? Drug use: No  ? Sexual activity: Not on file  ?Other Topics Concern  ? Not on file  ?Social History Narrative  ? Not on file  ? ?Social Determinants of Health  ? ?Financial Resource Strain: Not on file  ?Food Insecurity: Not on file  ?Transportation Needs: Not on file  ?Physical Activity: Not on file  ?Stress: Not on file  ?Social Connections: Not on file  ?Intimate Partner Violence: Not on file  ?  ? ?Family History  ?Problem Relation Age of Onset  ? Other Other   ?     noncontributory  ?  ? ?Review of Systems: ?All other systems reviewed and are otherwise negative except as noted above. ? ?Physical Exam: ?Vitals:  ? 06/12/21 0020 06/12/21 0042 06/12/21 0331 06/12/21 0729  ?BP: 138/72 (!) 153/85 (!) 157/82 (!) 156/77  ?Pulse: (!) 36 (!) 37 (!) 44 (!) 43  ?Resp: '13 18 18 18  '$ ?Temp:  98.2 ?F (36.8 ?C) 97.6 ?F (36.4 ?C) 98.1 ?F (36.7 ?C)  ?TempSrc:  Oral Oral Oral  ?SpO2: 94% 98% 96% 98%  ?Weight:  86.5 kg    ?Height:  6' (1.829 m)    ? ? ?GEN- The patient is well appearing, alert and oriented x 3 today.   ?HEENT: normocephalic, atraumatic; sclera clear, conjunctiva pink; hearing intact; oropharynx clear; neck supple ?Lungs- Clear to ausculation bilaterally, normal work of breathing.  No wheezes, rales, rhonchi ?Heart- Regular rate and  rhythm, no murmurs, rubs or gallops ?GI- soft, non-tender, non-distended, bowel sounds present ?Extremities- no clubbing, cyanosis, or edema; DP/PT/radial pulses 2+ bilaterally ?MS- no significant deformity or atrophy ?Skin- warm and dry, no rash or lesion ?Psych- euthymic mood, full affect ?Neuro- strength and sensation are intact ? ?Labs: ?  ?Lab Results  ?Component Value Date  ? WBC 6.4 06/11/2021  ? HGB 14.7 06/11/2021  ?  HCT 43.0 06/11/2021  ? MCV 88.1 06/11/2021  ? PLT 188 06/11/2021  ?  ?Recent Labs  ?Lab 06/11/21 ?1806  ?NA 137  ?K 3.7  ?CL 106  ?CO2 22  ?BUN 20  ?CREATININE 1.20  ?CALCIUM 8.8*  ?GLUCOSE 95  ? ? ?  ?Radiology/Studies: DG Chest Portable 1 View ? ?Result Date: 06/11/2021 ?CLINICAL DATA:  Shortness of breath. Dizziness. Second degree heart block EKG. EXAM: PORTABLE CHEST 1 VIEW COMPARISON:  Included portion from coronary CT 12/24/2020 FINDINGS: The heart is mildly enlarged. Normal mediastinal contours. Scattered subsegmental atelectasis in the lung bases without confluent airspace disease. No pulmonary edema, pleural effusion, or pneumothorax. No acute osseous findings. IMPRESSION: Mild cardiomegaly with scattered subsegmental atelectasis in the lung bases. Electronically Signed   By: Keith Rake M.D.   On: 06/11/2021 19:44   ? ?EKG: 2:1 AV block at 40 bpm (personally reviewed) ? ?TELEMETRY: 2:1 AV block 30-40s (personally reviewed) ? ? ?Assessment/Plan: ?1.  Second degree AV block, symptomatic. ?Echo planned for this am. EF normal by cMRI 09/2010 ?No AV nodal blockade ?Cath for this am as below.  ?Explained risks, benefits, and alternatives to PPM implantation, including but not limited to bleeding, infection, pneumothorax, pericardial effusion, lead dislodgement, heart attack, stroke, or death.  Pt verbalized understanding and agrees to proceed if indicated.  ? ?2. CAD ?CT cardiac score of 397 with majority in RCA.  ?Cath today per Dr. Virgina Jock ? ?Pt seen pre cath this am and consented in  the event that he needs pacer and requires any sedation for his cath. If significant RCA lesion, would plan observation after revascularization to determine if pacer still needed.  ? ?If non-interventiona

## 2021-06-12 NOTE — Progress Notes (Signed)
?  Echocardiogram ?2D Echocardiogram has been performed. ? ?Keith Jones ?06/12/2021, 9:41 AM ?

## 2021-06-12 NOTE — Interval H&P Note (Signed)
History and Physical Interval Note: ? ?06/12/2021 ?10:49 AM ? ?Keith Jones  has presented today for surgery, with the diagnosis of unstable angina.  The various methods of treatment have been discussed with the patient and family. After consideration of risks, benefits and other options for treatment, the patient has consented to  Procedure(s): ?LEFT HEART CATH AND CORONARY ANGIOGRAPHY (N/A) as a surgical intervention.  The patient's history has been reviewed, patient examined, no change in status, stable for surgery.  I have reviewed the patient's chart and labs.  Questions were answered to the patient's satisfaction.   ? ?2016 Appropriate Use Criteria for Coronary Revascularization in Patients With Acute Coronary Syndrome ?NSTEMI/Unstable angina, stabilized patient at Intermediate Risk (TIMI Score 3-4) ?Indication: ? ?Revascularization by PCI or CABG of 1 or more arteries in a patient with NSTEMI or unstable angina with ?Stabilization after presentation ?Intermediate risk for clinical events ?A (7) Indication: 16; Score 7 ?328 ? ?[place] ? ? ?

## 2021-06-12 NOTE — Progress Notes (Signed)
?   06/12/21 1432  ?Assess: MEWS Score  ?Temp 98.2 ?F (36.8 ?C)  ?BP 120/64  ?Pulse Rate (!) 40  ?ECG Heart Rate (!) 40  ?Resp 13  ?Level of Consciousness Alert  ?SpO2 98 %  ?O2 Device Room Air  ?Assess: MEWS Score  ?MEWS Temp 0  ?MEWS Systolic 0  ?MEWS Pulse 1  ?MEWS RR 1  ?MEWS LOC 0  ?MEWS Score 2  ?MEWS Score Color Yellow  ?Assess: if the MEWS score is Yellow or Red  ?Were vital signs taken at a resting state? Yes  ?Focused Assessment No change from prior assessment  ?Early Detection of Sepsis Score *See Row Information* Low  ?MEWS guidelines implemented *See Row Information* Yes  ?Treat  ?MEWS Interventions Escalated (See documentation below)  ?Pain Scale 0-10  ?Pain Score 0  ?Take Vital Signs  ?Increase Vital Sign Frequency  Yellow: Q 2hr X 2 then Q 4hr X 2, if remains yellow, continue Q 4hrs  ?Escalate  ?MEWS: Escalate Yellow: discuss with charge nurse/RN and consider discussing with provider and RRT  ?Notify: Charge Nurse/RN  ?Name of Charge Nurse/RN Notified Brooke, RN  ?Date Charge Nurse/RN Notified 06/12/21  ?Time Charge Nurse/RN Notified 1435  ?Document  ?Patient Outcome Other (Comment) ?(stable in room, monitor HR)  ?Progress note created (see row info) Yes  ? ? ?

## 2021-06-13 LAB — CBC
HCT: 41.6 % (ref 39.0–52.0)
Hemoglobin: 14.1 g/dL (ref 13.0–17.0)
MCH: 29.6 pg (ref 26.0–34.0)
MCHC: 33.9 g/dL (ref 30.0–36.0)
MCV: 87.2 fL (ref 80.0–100.0)
Platelets: 191 10*3/uL (ref 150–400)
RBC: 4.77 MIL/uL (ref 4.22–5.81)
RDW: 13 % (ref 11.5–15.5)
WBC: 9 10*3/uL (ref 4.0–10.5)
nRBC: 0 % (ref 0.0–0.2)

## 2021-06-13 LAB — BASIC METABOLIC PANEL
Anion gap: 7 (ref 5–15)
BUN: 19 mg/dL (ref 8–23)
CO2: 25 mmol/L (ref 22–32)
Calcium: 8.8 mg/dL — ABNORMAL LOW (ref 8.9–10.3)
Chloride: 108 mmol/L (ref 98–111)
Creatinine, Ser: 1.33 mg/dL — ABNORMAL HIGH (ref 0.61–1.24)
GFR, Estimated: 58 mL/min — ABNORMAL LOW (ref >60)
Glucose, Bld: 109 mg/dL — ABNORMAL HIGH (ref 70–99)
Potassium: 4.1 mmol/L (ref 3.5–5.1)
Sodium: 140 mmol/L (ref 135–145)

## 2021-06-13 NOTE — Progress Notes (Signed)
? ?Progress Note ? ?Patient Name: Keith Jones ?Date of Encounter: 06/13/2021 ? ?Cardiologist: Patwardhan ? ?Subjective  ? ?Currently feeling well.  Better after RCA PCI.  Continues to have heart block. ? ?Inpatient Medications  ?  ?Scheduled Meds: ? aspirin EC  81 mg Oral Daily  ? clopidogrel  75 mg Oral Q breakfast  ? docusate sodium  200 mg Oral Daily  ? folic acid  1 mg Oral Daily  ? heparin  5,000 Units Subcutaneous Q8H  ? multivitamin with minerals  1 tablet Oral Daily  ? rosuvastatin  20 mg Oral Daily  ? sodium chloride flush  3 mL Intravenous Q12H  ? sodium chloride flush  3 mL Intravenous Q12H  ? thiamine  100 mg Oral Daily  ? Or  ? thiamine  100 mg Intravenous Daily  ? ?Continuous Infusions: ? sodium chloride    ? ?PRN Meds: ?sodium chloride, acetaminophen, LORazepam **OR** LORazepam, ondansetron (ZOFRAN) IV, sodium chloride flush, zolpidem  ? ?Vital Signs  ?  ?Vitals:  ? 06/13/21 0000 06/13/21 0426 06/13/21 0429 06/13/21 0834  ?BP: (!) 138/54 (!) 146/68  (!) 154/63  ?Pulse: (!) 45 (!) 46 (!) 44 (!) 38  ?Resp:  16  18  ?Temp:  98.4 ?F (36.9 ?C)  97.8 ?F (36.6 ?C)  ?TempSrc:  Oral  Axillary  ?SpO2:  96% 96% 95%  ?Weight:   83.8 kg   ?Height:      ? ? ?Intake/Output Summary (Last 24 hours) at 06/13/2021 0842 ?Last data filed at 06/12/2021 1553 ?Gross per 24 hour  ?Intake 373.31 ml  ?Output --  ?Net 373.31 ml  ? ? ?  06/13/2021  ?  4:29 AM 06/12/2021  ? 12:42 AM 06/11/2021  ?  6:15 PM  ?Last 3 Weights  ?Weight (lbs) 184 lb 11.2 oz 190 lb 11.2 oz 180 lb  ?Weight (kg) 83.779 kg 86.5 kg 81.647 kg  ?   ? ?Telemetry  ?  ?Complete heart block- Personally Reviewed ? ?ECG  ?  ?Sinus rhythm, 2-1 AV block- Personally Reviewed ? ?Physical Exam  ? ?GEN: No acute distress.   ?Neck: No JVD ?Cardiac: Bradycardic, no murmurs, rubs, or gallops.  ?Respiratory: Clear to auscultation bilaterally. ?GI: Soft, nontender, non-distended  ?MS: No edema; No deformity. ?Neuro:  Nonfocal  ?Psych: Normal affect  ? ?Labs  ?  ?High Sensitivity  Troponin:   ?Recent Labs  ?Lab 06/11/21 ?1806 06/11/21 ?2042  ?TROPONINIHS 13 16  ?   ?Chemistry ?Recent Labs  ?Lab 06/11/21 ?1806 06/13/21 ?0304  ?NA 137 140  ?K 3.7 4.1  ?CL 106 108  ?CO2 22 25  ?GLUCOSE 95 109*  ?BUN 20 19  ?CREATININE 1.20 1.33*  ?CALCIUM 8.8* 8.8*  ?GFRNONAA >60 58*  ?ANIONGAP 9 7  ?  ?Lipids  ?Recent Labs  ?Lab 06/12/21 ?1838  ?CHOL 135  ?TRIG 57  ?HDL 53  ?[place] 71  ?CHOLHDL 2.5  ?  ?Hematology ?Recent Labs  ?Lab 06/11/21 ?1806 06/13/21 ?0304  ?WBC 6.4 9.0  ?RBC 4.88 4.77  ?HGB 14.7 14.1  ?HCT 43.0 41.6  ?MCV 88.1 87.2  ?MCH 30.1 29.6  ?MCHC 34.2 33.9  ?RDW 12.5 13.0  ?PLT 188 191  ? ?Thyroid  ?Recent Labs  ?Lab 06/11/21 ?1806  ?TSH 2.254  ?  ?BNP ?Recent Labs  ?Lab 06/11/21 ?1806  ?BNP 299.8*  ?  ?DDimer No results for input(s): DDIMER in the last 168 hours.  ? ?Radiology  ?  ?CARDIAC CATHETERIZATION ? ?Addendum Date:  06/12/2021   ?LM: Normal LAD: Normal Lcx: Mild mid Lcx disease RCA: Large, dominant vessel         Mid 80% stenosis This was felt to be possible etiology for symptomatic ischemic AV conduction disease               Successful percutaneous coronary intervention mRCA        PTCA and overlapping stent placement 4.0X18 & 4.0X15 mm Onyx Frontier drug-eluting stents        Second proximal stent necessitated by proximal edge dissection propagating into prox RCA        Post dilatation with 4.0X12 mm [place] balloon up to 22 atm Monitor next 24-48 hours to assess rhythm and symptoms to then decide whether patient would need permanent pacemaker. Appreciate EP input. Nigel Mormon, MD Pager: [phone] Office: [phone]  ? ?Result Date: 06/12/2021 ?LM: Normal LAD: Normal Lcx: Mild mid Lcx disease RCA: Large, dominant vessel         Mid 80% stenosis               Successful percutaneous coronary intervention mRCA        PTCA and overlapping stent placement 4.0X18 & 4.0X15 mm Onyx Frontier drug-eluting stents        Second proximal stent necessitated by proximal edge dissection  propagating into prox RCA        Post dilatation with 4.0X12 mm [place] balloon up to 22 atm  ? ?DG Chest Portable 1 View ? ?Result Date: 06/11/2021 ?CLINICAL DATA:  Shortness of breath. Dizziness. Second degree heart block EKG. EXAM: PORTABLE CHEST 1 VIEW COMPARISON:  Included portion from coronary CT 12/24/2020 FINDINGS: The heart is mildly enlarged. Normal mediastinal contours. Scattered subsegmental atelectasis in the lung bases without confluent airspace disease. No pulmonary edema, pleural effusion, or pneumothorax. No acute osseous findings. IMPRESSION: Mild cardiomegaly with scattered subsegmental atelectasis in the lung bases. Electronically Signed   By: Keith Rake M.D.   On: 06/11/2021 19:44  ? ?ECHOCARDIOGRAM COMPLETE ? ?Result Date: 06/12/2021 ?   ECHOCARDIOGRAM REPORT   Patient Name:   Keith Jones Date of Exam: 06/12/2021 Medical Rec #:  [id number]      Height:       72.0 in Accession #:    [id number]     Weight:       190.7 lb Date of Birth:  January 30, 1951      BSA:          2.088 m? Patient Age:    71 years       BP:           156/77 mmHg Patient Gender: M              HR:           41 bpm. Exam Location:  Inpatient Procedure: 2D Echo                                MODIFIED REPORT: This report was modified by Vernell Leep MD on 06/12/2021 due to [place].  Indications:     heart block  History:         Patient has prior history of Echocardiogram examinations, most                  recent 01/07/2010.  Sonographer:     Johny Chess RDCS Referring Phys:  [id number] [facility] J PATWARDHAN Diagnosing Phys:  Vernell Leep MD IMPRESSIONS  1. Left ventricular ejection fraction, by estimation, is 45 to 50%. The left ventricle has mildly decreased function. The left ventricle demonstrates global hypokinesis. The left ventricular internal cavity size was mildly dilated. not assessed due to 2:1 AV block.  2. Right ventricular systolic function is normal. The right ventricular size is normal.  3. The mitral valve is  grossly normal. Mild to moderate mitral valve regurgitation.  4. The aortic valve is normal in structure. Aortic valve regurgitation is mild. No aortic stenosis is present. Comparison(s): A prior study was performed on 2011. AV block is new. Aortic regurgitation is new. LA dilatation is not seen. FINDINGS  Left Ventricle: Left ventricular ejection fraction, by estimation, is 45 to 50%. The left ventricle has mildly decreased function. The left ventricle demonstrates global hypokinesis. The left ventricular internal cavity size was mildly dilated. There is  no left ventricular hypertrophy. Not assessed due to 2:1 AV block. Right Ventricle: The right ventricular size is normal. No increase in right ventricular wall thickness. Right ventricular systolic function is normal. Left Atrium: Left atrial size was normal in size. Right Atrium: Right atrial size was normal in size. Pericardium: There is no evidence of pericardial effusion. Mitral Valve: The mitral valve is grossly normal. Mild to moderate mitral valve regurgitation. Tricuspid Valve: The tricuspid valve is normal in structure. Tricuspid valve regurgitation is not demonstrated. No evidence of tricuspid stenosis. Aortic Valve: The aortic valve is normal in structure. Aortic valve regurgitation is mild. Aortic regurgitation PHT measures 1049 msec. No aortic stenosis is present. Pulmonic Valve: The pulmonic valve was normal in structure. Pulmonic valve regurgitation is not visualized. No evidence of pulmonic stenosis. Aorta: The aortic root and ascending aorta are structurally normal, with no evidence of dilitation. IAS/Shunts: The interatrial septum was not assessed.  LEFT VENTRICLE PLAX 2D LVIDd:         6.00 cm      Diastology LVIDs:         4.40 cm      LV e' medial:    12.50 cm/s LV PW:         0.90 cm      LV E/e' medial:  10.3 LV IVS:        0.70 cm      LV e' lateral:   13.20 cm/s LVOT diam:     2.40 cm      LV E/e' lateral: 9.8 LV SV:         82 LV SV  Index:   39 LVOT Area:     4.52 cm?  LV Volumes (MOD) LV vol d, MOD A4C: 141.0 ml LV vol s, MOD A4C: 79.5 ml LV SV MOD A4C:     141.0 ml IVC IVC diam: 2.10 cm LEFT ATRIUM             Index        RIGHT ATRIUM

## 2021-06-13 NOTE — Progress Notes (Signed)
CARDIAC REHAB PHASE I  ? ?PRE:  Rate/Rhythm: HB 40 ? ?  BP: sitting 135/63 ? ?  SaO2: 98 RA ? ?MODE:  Ambulation: 450 ft  ? ?POST:  Rate/Rhythm: HB 55 with exertion, returned to 39 at rest ? ?  BP: sitting 142/56  ? ?  SaO2: 97 RA ?[id number] ?Patient ambulated in hallway independently denying complaints. Steady gait noted. Post ambulation patient back to bedside sitting. CR will continue to follow until discharge. Anticipate PPM early next week. Patient instructed to walk as tolerated with staff.   ? ?Welton Flakes, BSN  ?

## 2021-06-13 NOTE — Progress Notes (Addendum)
Subjective:  ?No symptoms at rest ? ?S/p mid RCA PCI on 06/12/2021 ? ?Continues to have bradycardia episodes, see below for details ? ?Objective:  ?Vital Signs in the last 24 hours: ?Temp:  [97.8 ?F (36.6 ?C)-98.7 ?F (37.1 ?C)] 98.4 ?F (36.9 ?C) (04/08 0426) ?Pulse Rate:  [32-50] 44 (04/08 0429) ?Resp:  [8-18] 16 (04/08 0426) ?BP: (106-182)/(46-83) 146/68 (04/08 0426) ?SpO2:  [95 %-100 %] 96 % (04/08 0429) ?Weight:  [83.8 kg] 83.8 kg (04/08 0429) ? ?Intake/Output from previous day: ?04/07 0701 - 04/08 0700 ?In: 373.3 [I.V.:373.3] ?Out: -  ? ?Physical Exam ?Vitals and nursing note reviewed.  ?Constitutional:   ?   General: He is not in acute distress. ?Neck:  ?   Vascular: No JVD.  ?Cardiovascular:  ?   Rate and Rhythm: Regular rhythm. Bradycardia present.  ?   Heart sounds: Normal heart sounds. No murmur heard. ?Pulmonary:  ?   Effort: Pulmonary effort is normal.  ?   Breath sounds: Normal breath sounds. No wheezing or rales.  ? ? ? ?Imaging/tests reviewed and independently interpreted: ? ?Cardiac Studies: ? ?Telemetry 06/13/2021: ?Intermittent high-grade AV block with nonconducted P waves ? ?EKG 06/12/2021: ?Sinus rhythm with 2: 1 AV block  ?Right bundle branch block ? ? ?Echocardiogram 06/12/2021: ? 1. Left ventricular ejection fraction, by estimation, is 45 to 50%. The  ?left ventricle has mildly decreased function. The left ventricle  ?demonstrates global hypokinesis. The left ventricular internal cavity size  ?was mildly dilated. not assessed due to  ?2:1 AV block.  ? 2. Right ventricular systolic function is normal. The right ventricular  ?size is normal.  ? 3. The mitral valve is grossly normal. Mild to moderate mitral valve  ?regurgitation.  ? 4. The aortic valve is normal in structure. Aortic valve regurgitation is  ?mild. No aortic stenosis is present.  ? ?Comparison(s): A prior study was performed on 2011. AV block is new.  ?Aortic regurgitation is new. LA dilatation is not seen.  ? ?Coronary intervention  06/12/2021: ?LM: Normal ?LAD: Normal ?Lcx: Mild mid Lcx disease ?RCA: Large, dominant vessel ?        Mid 80% stenosis ?  ?This was felt to be possible etiology for symptomatic ischemic AV conduction disease ?        ?       Successful percutaneous coronary intervention mRCA ?       PTCA and overlapping stent placement 4.0X18 & 4.0X15 mm Onyx Frontier drug-eluting stents ?       Second proximal stent necessitated by proximal edge dissection propagating into prox RCA ?       Post dilatation with 4.0X12 mm [place] balloon up to 22 atm ?  ?Monitor next 24-48 hours to assess rhythm and symptoms to then decide whether patient would need permanent pacemaker. Appreciate EP input.  ?  ?CT cardiac scoring 12/24/2020: ?LM: 0 ?LAD: 55 ?Lcx: 0 ?RCA: 341 ?Total: 341 (68th percentile) ?  ?CCTA 2011: ?1)    Calcium score 150  ?2)    No critical CAD.  < 30% calcified plaque in small OM2.  Less  ?than 50% calcified plaque in proximal RCA.  ?  ?CMRI 2011: ?Findings:  ? ?There was mild LVE with mild diffuse hypokinesis slightly worse in  ?the posterior basal region.  LA/RA and RV were normal.  No ASD or  ?VSD.  No pericardial effusion. Aortic and mitral valves  ?structurally normal  ? ?EDV 170cc  ?ESV 83 cc  ?SV: 87  cc  ? ?EF: 51%  ? ?Impression: Mild LVE with low normal EF 51%  ?Findings:  ? ?There was mild LVE with mild diffuse hypokinesis slightly worse in  ?the posterior basal region.  LA/RA and RV were normal.  No ASD or  ?VSD.  No pericardial effusion. Aortic and mitral valves  ?structurally normal  ? ?EDV 170cc  ?ESV 83 cc  ?SV: 87 cc  ? ?EF: 51%  ? ?Impression: Mild LVE with low normal EF 51%  ?  ?Echocardiogram 2011: ?- Left ventricle: The cavity size was at the upper limits of normal.  ?   Systolic function was mildly reduced. The estimated ejection  ?   fraction was in the range of 45% to 50%. There is hypokinesis of  ?   the mid-distal lateral and inferolateral myocardium. Doppler  ?   parameters are consistent with abnormal left  ventricular  ?   relaxation (grade 1 diastolic dysfunction).  ? - Left atrium: The atrium was moderately dilated.  ? ?Assessment & Recommendations: ? ?71 y.o. Caucasian male  with hypertension, hyperlipidemia, elevated coronary calcium score, admitted with lightheadedness, dyspnea on exertion, AV block ?  ?AV block: ?Symptomatic 2:1 AV block with intermittent high-grade AV block.  Ventricular rate in 30s and 40s with underlying left bundle branch block  ?In spite of successful PCI to mid RCA, it is now evident that his AV block is not ischemic in origin.  He will need a permanent pacemaker.   ?Given Plavix load on 06/12/2021, and continued need for DAPT with aspirin and Plavix, optimal timing for pacemaker is the question.  Waiting for 3 to 5 days since Plavix load may be prudent.   ?Defer pacemaker date to EP, but leaning towards Tuesday, 06/16/2021. ?To reduce bleeding risk, discontinued SQ lovenox ? ?CAD: ?Severe mid RCA stenosis s/p successful PCI with overlapping stents 4.0 x 18, and 4.0 x 50 mm Onyx stents. ?Continue aspirin and Plavix for at least 6 months. ?Continue Crestor 20 mg. ?No beta-blockers given AV block. ? ?Anticipate discharge on 4/12 or 4/13. ? ? ?Nigel Mormon, MD ?Pager: [phone] ?Office: [phone] ? ?

## 2021-06-14 LAB — BASIC METABOLIC PANEL
Anion gap: 7 (ref 5–15)
BUN: 17 mg/dL (ref 8–23)
CO2: 23 mmol/L (ref 22–32)
Calcium: 8.5 mg/dL — ABNORMAL LOW (ref 8.9–10.3)
Chloride: 109 mmol/L (ref 98–111)
Creatinine, Ser: 1.16 mg/dL (ref 0.61–1.24)
GFR, Estimated: >60 mL/min (ref >60)
Glucose, Bld: 101 mg/dL — ABNORMAL HIGH (ref 70–99)
Potassium: 3.4 mmol/L — ABNORMAL LOW (ref 3.5–5.1)
Sodium: 139 mmol/L (ref 135–145)

## 2021-06-14 LAB — SURGICAL PCR SCREEN
MRSA, PCR: NEGATIVE
Staphylococcus aureus: NEGATIVE

## 2021-06-14 LAB — GLUCOSE, CAPILLARY: Glucose-Capillary: 113 mg/dL — ABNORMAL HIGH (ref 70–99)

## 2021-06-14 MED ORDER — POTASSIUM CHLORIDE CRYS ER 20 MEQ PO TBCR
40.0000 meq | EXTENDED_RELEASE_TABLET | Freq: Once | ORAL | Status: AC
Start: 2021-06-14 — End: 2021-06-14
  Administered 2021-06-14: 40 meq via ORAL
  Filled 2021-06-14: qty 2

## 2021-06-14 NOTE — Progress Notes (Signed)
Subjective:  ?No symptoms at rest ?Ambulating in hallway with assistance without complaints.  ? ?S/p mid RCA PCI on 06/12/2021 ? ?Intermittent third degree AV block on telemetry ? ?Objective:  ?Vital Signs in the last 24 hours: ?Temp:  [98.1 ?F (36.7 ?C)-98.2 ?F (36.8 ?C)] 98.2 ?F (36.8 ?C) (04/09 7494) ?Pulse Rate:  [38-41] 38 (04/09 0021) ?Resp:  [14-20] 14 (04/09 0558) ?BP: (137-148)/(63-66) 137/66 (04/09 0558) ?SpO2:  [95 %-98 %] 98 % (04/09 0558) ?Weight:  [84.2 kg] 84.2 kg (04/09 0558) ? ?Intake/Output from previous day: ?04/08 0701 - 04/09 0700 ?In: 480 [P.O.:480] ?Out: -  ? ?Physical Exam ?Vitals and nursing note reviewed.  ?Constitutional:   ?   General: He is not in acute distress. ?Neck:  ?   Vascular: No JVD.  ?Cardiovascular:  ?   Rate and Rhythm: Regular rhythm. Bradycardia present.  ?   Heart sounds: Normal heart sounds. No murmur heard. ?Pulmonary:  ?   Effort: Pulmonary effort is normal.  ?   Breath sounds: Normal breath sounds. No wheezing or rales.  ? ? ? ?Imaging/tests reviewed and independently interpreted: ? ?Cardiac Studies: ? ?Telemetry 06/13/2021: ?Intermittent high-grade AV block with nonconducted P waves ? ?EKG 06/12/2021: ?Sinus rhythm with 2: 1 AV block  ?Right bundle branch block ? ? ?Echocardiogram 06/12/2021: ? 1. Left ventricular ejection fraction, by estimation, is 45 to 50%. The  ?left ventricle has mildly decreased function. The left ventricle  ?demonstrates global hypokinesis. The left ventricular internal cavity size  ?was mildly dilated. not assessed due to  ?2:1 AV block.  ? 2. Right ventricular systolic function is normal. The right ventricular  ?size is normal.  ? 3. The mitral valve is grossly normal. Mild to moderate mitral valve  ?regurgitation.  ? 4. The aortic valve is normal in structure. Aortic valve regurgitation is  ?mild. No aortic stenosis is present.  ? ?Comparison(s): A prior study was performed on 2011. AV block is new.  ?Aortic regurgitation is new. LA dilatation is  not seen.  ? ?Coronary intervention 06/12/2021: ?LM: Normal ?LAD: Normal ?Lcx: Mild mid Lcx disease ?RCA: Large, dominant vessel ?        Mid 80% stenosis ?  ?This was felt to be possible etiology for symptomatic ischemic AV conduction disease ?        ?       Successful percutaneous coronary intervention mRCA ?       PTCA and overlapping stent placement 4.0X18 & 4.0X15 mm Onyx Frontier drug-eluting stents ?       Second proximal stent necessitated by proximal edge dissection propagating into prox RCA ?       Post dilatation with 4.0X12 mm [place] balloon up to 22 atm ?  ?Monitor next 24-48 hours to assess rhythm and symptoms to then decide whether patient would need permanent pacemaker. Appreciate EP input.  ?  ?CT cardiac scoring 12/24/2020: ?LM: 0 ?LAD: 55 ?Lcx: 0 ?RCA: 341 ?Total: 341 (68th percentile) ?  ?CCTA 2011: ?1)    Calcium score 150  ?2)    No critical CAD.  < 30% calcified plaque in small OM2.  Less  ?than 50% calcified plaque in proximal RCA.  ?  ?CMRI 2011: ?Findings:  ? ?There was mild LVE with mild diffuse hypokinesis slightly worse in  ?the posterior basal region.  LA/RA and RV were normal.  No ASD or  ?VSD.  No pericardial effusion. Aortic and mitral valves  ?structurally normal  ? ?EDV 170cc  ?  ESV 83 cc  ?SV: 87 cc  ? ?EF: 51%  ? ?Impression: Mild LVE with low normal EF 51%  ?Findings:  ? ?There was mild LVE with mild diffuse hypokinesis slightly worse in  ?the posterior basal region.  LA/RA and RV were normal.  No ASD or  ?VSD.  No pericardial effusion. Aortic and mitral valves  ?structurally normal  ? ?EDV 170cc  ?ESV 83 cc  ?SV: 87 cc  ? ?EF: 51%  ? ?Impression: Mild LVE with low normal EF 51%  ?  ?Echocardiogram 2011: ?- Left ventricle: The cavity size was at the upper limits of normal.  ?   Systolic function was mildly reduced. The estimated ejection  ?   fraction was in the range of 45% to 50%. There is hypokinesis of  ?   the mid-distal lateral and inferolateral myocardium. Doppler  ?    parameters are consistent with abnormal left ventricular  ?   relaxation (grade 1 diastolic dysfunction).  ? - Left atrium: The atrium was moderately dilated.  ? ?Assessment & Recommendations: ? ?71 y.o. Caucasian male  with hypertension, hyperlipidemia, elevated coronary calcium score, admitted with lightheadedness, dyspnea on exertion, AV block ?  ?AV block: ?Symptomatic 2:1 AV block with intermittent third degree AV block.   ?Ventricular rate in 30s and 40s with underlying right bundle branch block  ?In spite of successful PCI to mid RCA, it is now evident that his AV block is not ischemic in origin.  He will need a permanent pacemaker.   ?Given Plavix load on 06/12/2021, and continued need for DAPT with aspirin and Plavix, optimal timing for pacemaker is the question.  Waiting for 3 to 5 days since Plavix load may be prudent.   ?Plan for pacemaker 06/16/2021. ?To reduce bleeding risk, not on SQ lovenox or heparin. ? ?CAD: ?Severe mid RCA stenosis s/p successful PCI with overlapping stents 4.0 x 18, and 4.0 x 50 mm Onyx stents. ?Continue aspirin and Plavix for at least 6 months. ?Continue Crestor 20 mg. ?No beta-blockers given AV block. ? ?Anticipate discharge on 4/12 or 4/13. ? ? ?Nigel Mormon, MD ?Pager: [phone] ?Office: [phone] ? ?

## 2021-06-15 ENCOUNTER — Encounter (HOSPITAL_COMMUNITY): Payer: Self-pay | Admitting: Cardiology

## 2021-06-15 LAB — BASIC METABOLIC PANEL
Anion gap: 6 (ref 5–15)
BUN: 19 mg/dL (ref 8–23)
CO2: 24 mmol/L (ref 22–32)
Calcium: 8.7 mg/dL — ABNORMAL LOW (ref 8.9–10.3)
Chloride: 108 mmol/L (ref 98–111)
Creatinine, Ser: 1.22 mg/dL (ref 0.61–1.24)
GFR, Estimated: >60 mL/min (ref >60)
Glucose, Bld: 100 mg/dL — ABNORMAL HIGH (ref 70–99)
Potassium: 4 mmol/L (ref 3.5–5.1)
Sodium: 138 mmol/L (ref 135–145)

## 2021-06-15 LAB — POCT ACTIVATED CLOTTING TIME
Activated Clotting Time: 438 seconds
Activated Clotting Time: 239 seconds

## 2021-06-15 MED ORDER — SODIUM CHLORIDE 0.9 % IV SOLN
80.0000 mg | INTRAVENOUS | Status: AC
  Administered 2021-06-16: 80 mg
  Filled 2021-06-15: qty 2

## 2021-06-15 MED ORDER — GENTAMICIN SULFATE 40 MG/ML IJ SOLN
80.0000 mg | INTRAMUSCULAR | Status: DC
  Filled 2021-06-15: qty 2

## 2021-06-15 MED ORDER — SODIUM CHLORIDE 0.9 % IV SOLN: INTRAVENOUS | Status: DC

## 2021-06-15 MED ORDER — CEFAZOLIN SODIUM-DEXTROSE 2-4 GM/100ML-% IV SOLN
2.0000 g | INTRAVENOUS | Status: AC
  Administered 2021-06-16: 2 g via INTRAVENOUS
  Filled 2021-06-15: qty 100

## 2021-06-15 MED ORDER — SODIUM CHLORIDE 0.9% FLUSH: 3.0000 mL | INTRAVENOUS | Status: DC | PRN

## 2021-06-15 MED ORDER — SODIUM CHLORIDE 0.9 % IV SOLN: 250.0000 mL | INTRAVENOUS | Status: DC

## 2021-06-15 MED ORDER — SODIUM CHLORIDE 0.9% FLUSH
3.0000 mL | Freq: Two times a day (BID) | INTRAVENOUS | Status: DC
  Administered 2021-06-15 – 2021-06-17 (×4): 3 mL via INTRAVENOUS

## 2021-06-15 MED ORDER — CHLORHEXIDINE GLUCONATE 4 % EX LIQD
60.0000 mL | Freq: Once | CUTANEOUS | Status: AC
  Administered 2021-06-15: 4 via TOPICAL
  Filled 2021-06-15: qty 60

## 2021-06-15 MED ORDER — CHLORHEXIDINE GLUCONATE 4 % EX LIQD
60.0000 mL | Freq: Once | CUTANEOUS | Status: AC
  Administered 2021-06-16: 4 via TOPICAL
  Filled 2021-06-15: qty 60

## 2021-06-15 MED ORDER — CEFAZOLIN SODIUM-DEXTROSE 2-4 GM/100ML-% IV SOLN
2.0000 g | INTRAVENOUS | Status: DC
  Filled 2021-06-15: qty 100

## 2021-06-15 NOTE — Progress Notes (Signed)
Notified pt heart rate dropped to 28 nonsustained and has had 2nd and 3rd degree heart block.  Pt HR currently in the 30s.  Pt awake and denies any c/o.  BP stable.  Will continue to monitor closely.  ?

## 2021-06-15 NOTE — Care Management Important Message (Signed)
Important Message ? ?Patient Details  ?Name: Keith Jones ?MRN: [id number] ?Date of Birth: March 30, 1950 ? ? ?Medicare Important Message Given:  Yes ? ? ? ? ?Shelda Altes ?06/15/2021, 9:02 AM ?

## 2021-06-15 NOTE — Progress Notes (Signed)
Subjective:  ?No symptoms at rest ?Ambulating in hallway with assistance without complaints.  ? ?S/p mid RCA PCI on 06/12/2021 ? ?Intermittent third degree AV block on telemetry ?Episodes of 1:1 AV conduction seen on telemetry (06/15/2021) ? ?Objective:  ?Vital Signs in the last 24 hours: ?Temp:  [97.8 ?F (36.6 ?C)-98.5 ?F (36.9 ?C)] 98.4 ?F (36.9 ?C) (04/10 1030) ?Pulse Rate:  [39-62] 42 (04/10 0412) ?Resp:  [15-20] 15 (04/10 0412) ?BP: (131-142)/(62-74) 141/72 (04/10 0412) ?SpO2:  [97 %-98 %] 97 % (04/10 0412) ?Weight:  [85.5 kg] 85.5 kg (04/10 0412) ? ?Intake/Output from previous day: ?04/09 0701 - 04/10 0700 ?In: 960 [P.O.:960] ?Out: -  ? ?Physical Exam ?Vitals and nursing note reviewed.  ?Constitutional:   ?   General: He is not in acute distress. ?Neck:  ?   Vascular: No JVD.  ?Cardiovascular:  ?   Rate and Rhythm: Regular rhythm. Bradycardia present.  ?   Heart sounds: Normal heart sounds. No murmur heard. ?Pulmonary:  ?   Effort: Pulmonary effort is normal.  ?   Breath sounds: Normal breath sounds. No wheezing or rales.  ? ? ? ?Imaging/tests reviewed and independently interpreted: ? ?Cardiac Studies: ? ?Telemetry 06/15/2021: ?Intermittent 2:1 and third degree AV block ?Intermittent junctional rhythm at normal rate, as well as episodes of 1:1 AV conduction also noted ? ?  ? ? ? ?EKG 06/12/2021: ?Sinus rhythm with 2: 1 AV block  ?Right bundle branch block ? ? ?Echocardiogram 06/12/2021: ? 1. Left ventricular ejection fraction, by estimation, is 45 to 50%. The  ?left ventricle has mildly decreased function. The left ventricle  ?demonstrates global hypokinesis. The left ventricular internal cavity size  ?was mildly dilated. not assessed due to  ?2:1 AV block.  ? 2. Right ventricular systolic function is normal. The right ventricular  ?size is normal.  ? 3. The mitral valve is grossly normal. Mild to moderate mitral valve  ?regurgitation.  ? 4. The aortic valve is normal in structure. Aortic valve regurgitation is   ?mild. No aortic stenosis is present.  ? ?Comparison(s): A prior study was performed on 2011. AV block is new.  ?Aortic regurgitation is new. LA dilatation is not seen.  ? ?Coronary intervention 06/12/2021: ?LM: Normal ?LAD: Normal ?Lcx: Mild mid Lcx disease ?RCA: Large, dominant vessel ?        Mid 80% stenosis ?  ?This was felt to be possible etiology for symptomatic ischemic AV conduction disease ?        ?       Successful percutaneous coronary intervention mRCA ?       PTCA and overlapping stent placement 4.0X18 & 4.0X15 mm Onyx Frontier drug-eluting stents ?       Second proximal stent necessitated by proximal edge dissection propagating into prox RCA ?       Post dilatation with 4.0X12 mm [place] balloon up to 22 atm ?  ?Monitor next 24-48 hours to assess rhythm and symptoms to then decide whether patient would need permanent pacemaker. Appreciate EP input.  ?  ?CT cardiac scoring 12/24/2020: ?LM: 0 ?LAD: 55 ?Lcx: 0 ?RCA: 341 ?Total: 341 (68th percentile) ?  ?CCTA 2011: ?1)    Calcium score 150  ?2)    No critical CAD.  < 30% calcified plaque in small OM2.  Less  ?than 50% calcified plaque in proximal RCA.  ?  ?CMRI 2011: ?Findings:  ? ?There was mild LVE with mild diffuse hypokinesis slightly worse in  ?the posterior basal  region.  LA/RA and RV were normal.  No ASD or  ?VSD.  No pericardial effusion. Aortic and mitral valves  ?structurally normal  ? ?EDV 170cc  ?ESV 83 cc  ?SV: 87 cc  ? ?EF: 51%  ? ?Impression: Mild LVE with low normal EF 51%  ?Findings:  ? ?There was mild LVE with mild diffuse hypokinesis slightly worse in  ?the posterior basal region.  LA/RA and RV were normal.  No ASD or  ?VSD.  No pericardial effusion. Aortic and mitral valves  ?structurally normal  ? ?EDV 170cc  ?ESV 83 cc  ?SV: 87 cc  ? ?EF: 51%  ? ?Impression: Mild LVE with low normal EF 51%  ?  ?Echocardiogram 2011: ?- Left ventricle: The cavity size was at the upper limits of normal.  ?   Systolic function was mildly reduced. The  estimated ejection  ?   fraction was in the range of 45% to 50%. There is hypokinesis of  ?   the mid-distal lateral and inferolateral myocardium. Doppler  ?   parameters are consistent with abnormal left ventricular  ?   relaxation (grade 1 diastolic dysfunction).  ? - Left atrium: The atrium was moderately dilated.  ? ?Assessment & Recommendations: ? ?71 y.o. Caucasian male  with hypertension, hyperlipidemia, elevated coronary calcium score, admitted with lightheadedness, dyspnea on exertion, AV block ?  ?AV block: ?Symptomatic 2:1 AV block with intermittent third degree AV block.   ?Ventricular rate in 30s and 40s with underlying right bundle branch block  ?Remarkably, he has had some 1;1 normal AV conduction noted on telemetry 06/15/2021. These episodes are still short lasting. Overall AV conduction remains impaired. Unless any remarkable stability occurs in 1:1 AV conduction in next 24 hours, I still think he needs PPM. Will follow.  ?Plan to 4/11 AM. ?Continue DAPT with Aspirin, plavix given PCI on 06/12/2021. ?To reduce bleeding risk, not on SQ lovenox or heparin. ? ?CAD: ?Severe mid RCA stenosis s/p successful PCI with overlapping stents 4.0 x 18, and 4.0 x 50 mm Onyx stents. ?Continue aspirin and Plavix for at least 6 months. ?Continue Crestor 20 mg. ?No beta-blockers given AV block. ? ?Anticipate discharge on 4/12 or 4/13. ? ? ?Nigel Mormon, MD ?Pager: [phone] ?Office: [phone] ? ?

## 2021-06-15 NOTE — Progress Notes (Addendum)
Patient is from home with spouse. Plan is for pace maker. Patient denies any issues with home medications or transportation. No home DME.  ?TOC following. ?

## 2021-06-15 NOTE — Progress Notes (Addendum)
? ?Progress Note ? ?Patient Name: Keith Jones ?Date of Encounter: 06/15/2021 ? ?[facility] Cardiologist: Dr. Veneda Melter ? ?Subjective  ? ?Feels "fine" no CP, no SOB, no dizziness ? ?Inpatient Medications  ?  ?Scheduled Meds: ? aspirin EC  81 mg Oral Daily  ? clopidogrel  75 mg Oral Q breakfast  ? docusate sodium  200 mg Oral Daily  ? folic acid  1 mg Oral Daily  ? multivitamin with minerals  1 tablet Oral Daily  ? rosuvastatin  20 mg Oral Daily  ? thiamine  100 mg Oral Daily  ? Or  ? thiamine  100 mg Intravenous Daily  ? ?Continuous Infusions: ? sodium chloride    ? ?PRN Meds: ?sodium chloride, acetaminophen, LORazepam **OR** LORazepam, ondansetron (ZOFRAN) IV, sodium chloride flush, zolpidem  ? ?Vital Signs  ?  ?Vitals:  ? 06/14/21 1559 06/14/21 2218 06/15/21 0101 06/15/21 0412  ?BP: 131/74 133/62 (!) 142/62 (!) 141/72  ?Pulse: 62 (!) 46 (!) 39 (!) 42  ?Resp: _0 ?Temp:  97.8 ?F (36.6 ?C) 98.5 ?F (36.9 ?C) 98.4 ?F (36.9 ?C)  ?TempSrc:  Oral Oral Oral  ?SpO2: 97% 98% 98% 97%  ?Weight:    85.5 kg  ?Height:      ? ? ?Intake/Output Summary (Last 24 hours) at 06/15/2021 0805 ?Last data filed at 06/14/2021 1900 ?Gross per 24 hour  ?Intake 720 ml  ?Output --  ?Net 720 ml  ? ? ?  06/15/2021  ?  4:12 AM 06/14/2021  ?  5:58 AM 06/13/2021  ?  4:29 AM  ?Last 3 Weights  ?Weight (lbs) 188 lb 6.4 oz 185 lb 11.2 oz 184 lb 11.2 oz  ?Weight (kg) 85.458 kg 84.233 kg 83.779 kg  ?   ? ?Telemetry  ?  ?Mostly 2:1AV block, some brief 1:1 intermittently, once NSVT 22beats rates generally about 40- Personally Reviewed ? ?ECG  ?  ?2:1 AVblock, RBBB, 40bpm - Personally Reviewed ? ?Physical Exam  ? ?GEN: No acute distress.   ?Neck: No JVD ?Cardiac: RRR, bradycardic, no murmurs, rubs, or gallops.  ?Respiratory: CTA b/l. ?GI: Soft, nontender, non-distended  ?MS: No edema; No deformity. ?Neuro:  Nonfocal  ?Psych: Normal affect  ? ?Labs  ?  ?High Sensitivity Troponin:   ?Recent Labs  ?Lab 06/11/21 ?1806 06/11/21 ?2042  ?TROPONINIHS 13 16  ?    ?Chemistry ?Recent Labs  ?Lab 06/13/21 ?0304 06/14/21 ?6378 06/15/21 ?0429  ?NA 140 139 138  ?K 4.1 3.4* 4.0  ?CL 108 109 108  ?CO2 _1 ?GLUCOSE 109* 101* 100*  ?BUN _2 ?CREATININE 1.33* 1.16 1.22  ?CALCIUM 8.8* 8.5* 8.7*  ?GFRNONAA 58* >60 >60  ?ANIONGAP _3 ?  ?Lipids  ?Recent Labs  ?Lab 06/12/21 ?1838  ?CHOL 135  ?TRIG 57  ?HDL 53  ?[place] 71  ?CHOLHDL 2.5  ?  ?Hematology ?Recent Labs  ?Lab 06/11/21 ?1806 06/13/21 ?0304  ?WBC 6.4 9.0  ?RBC 4.88 4.77  ?HGB 14.7 14.1  ?HCT 43.0 41.6  ?MCV 88.1 87.2  ?MCH 30.1 29.6  ?MCHC 34.2 33.9  ?RDW 12.5 13.0  ?PLT 188 191  ? ?Thyroid  ?Recent Labs  ?Lab 06/11/21 ?1806  ?TSH 2.254  ?  ?BNP ?Recent Labs  ?Lab 06/11/21 ?1806  ?BNP 299.8*  ?  ?DDimer No results for input(s): DDIMER in the last 168 hours.  ? ?Radiology  ?  ?No results found. ? ?Cardiac Studies  ? ? ?06/12/2021:  LHC/PCI ?LM: Normal ?LAD: Normal ?Lcx: Mild mid Lcx disease ?RCA: Large, dominant vessel ?        Mid 80% stenosis ?  ?This was felt to be possible etiology for symptomatic ischemic AV conduction disease ?        ?       Successful percutaneous coronary intervention mRCA ?       PTCA and overlapping stent placement 4.0X18 & 4.0X15 mm Onyx Frontier drug-eluting stents ?       Second proximal stent necessitated by proximal edge dissection propagating into prox RCA ?       Post dilatation with 4.0X12 mm [place] balloon up to 22 atm ?  ?Monitor next 24-48 hours to assess rhythm and symptoms to then decide whether patient would need permanent pacemaker. Appreciate EP input.  ? ? ?06/12/2021: TTE ? 1. Left ventricular ejection fraction, by estimation, is 45 to 50%. The  ?left ventricle has mildly decreased function. The left ventricle  ?demonstrates global hypokinesis. The left ventricular internal cavity size  ?was mildly dilated. not assessed due to  ?2:1 AV block.  ? 2. Right ventricular systolic function is normal. The right ventricular  ?size is normal.  ? 3. The mitral valve is grossly normal. Mild  to moderate mitral valve  ?regurgitation.  ? 4. The aortic valve is normal in structure. Aortic valve regurgitation is  ?mild. No aortic stenosis is present.  ? ?Comparison(s): A prior study was performed on 2011. AV block is new.  ?Aortic regurgitation is new. LA dilatation is not seen.  ? ?Patient Profile  ?   ?71 y.o. male without PMHx of variable AV conduction in 2011 during a prostatectomy procedure.  Seen at that time and Echo showed mild basal inferior hypokinesis with EF of 45%. cMRI performed showed HK, but mild HK of slightly worse in posterior basal region with EF 51%. He did have noncritical CAD with calcium score 150 back then.  More recently, CT cardiac scoring showed calcium score of 68th percentile, predominantly in RCA and LAD, RBBB, bifascicular block admitted with symptomatic bradycardia, found in 2:1 AVBlock ? ?No syncope ?HS Trop x2 negative ?Cath with CAD > PCI to RCA ?LVEF 45-50% ? ?Assessment & Plan  ?  ?1.  Second degree AV block, symptomatic. ?Despite RCA disease/intervention ?Longstanding conduction system disease, no MI/ACS feel PPM is indicated ?We can try to look for today if schedule allows, otherwise tomorrow ?NPO after breakfast ? ?ASA/Plavix increases bleeding risk, pt aware ?Re discussed procedure, potential risks/benefits, he remains agreeable ?Will plan for tomorrow  ?  ?2. CAD ?Cath as above ?RCA stent ?C/w Dr. Nadean Corwin ? ?For questions or updates, please contact [facility] ?Please consult [email or web address] for contact info under  ? ?  ?   ?Signed, ?Baldwin Jamaica, PA-C  ?06/15/2021, 8:05 AM   ? ? ?Rate has improved significantly   ?Will follow and see if he is able sustain normal conduction ?Reviewed with Dr MP  ?

## 2021-06-15 NOTE — TOC CAGE-AID Note (Signed)
Transition of Care (TOC) - CAGE-AID Screening ? ? ?Patient Details  ?Name: Keith Jones ?MRN: [id number] ?Date of Birth: 1950-05-16 ? ?Transition of Care (TOC) CM/SW Contact:    ?Pollie Friar, RN ?Phone Number: ?06/15/2021, 3:10 PM ? ? ?Clinical Narrative: ?Patient states he only drinks alcohol occasionally and he denies the need for inpatient/ outpatient alcohol counseling resources.  ? ? ?CAGE-AID Screening: ?  ? ?Have You Ever Felt You Ought to Cut Down on Your Drinking or Drug Use?: No ?Have People Annoyed You By Critizing Your Drinking Or Drug Use?: No ?Have You Felt Bad Or Guilty About Your Drinking Or Drug Use?: No ?Have You Ever Had a Drink or Used Drugs First Thing In The Morning to Steady Your Nerves or to Get Rid of a Hangover?: No ?CAGE-AID Score: 0 ? ?Substance Abuse Education Offered: Yes (refused) ? ?  ? ? ? ? ? ? ?

## 2021-06-15 NOTE — Progress Notes (Signed)
Pt ambulating independently in halls, numerous laps, no c/o. Talked with pt in room, intermittent 2nd deg, occ 3rd deg. Asx. Discussed with pt stent, need for Plavix, diet, exercise, Off the Beat book, and CRPII. Pt receptive. He is hoping to avoid ppm. Will refer to [facility].  ?[id number] ?Yves Dill CES, ACSM ?3:21 PM ?06/15/2021 ? ?

## 2021-06-16 ENCOUNTER — Other Ambulatory Visit (HOSPITAL_COMMUNITY): Payer: Self-pay

## 2021-06-16 ENCOUNTER — Encounter (HOSPITAL_COMMUNITY): Payer: Self-pay | Admitting: Internal Medicine

## 2021-06-16 ENCOUNTER — Inpatient Hospital Stay (HOSPITAL_COMMUNITY)
Admission: EM | Disposition: A | Discharge status: Home / Self Care | Payer: Self-pay | Source: Ambulatory Visit | Attending: Cardiology

## 2021-06-16 DIAGNOSIS — I441 Atrioventricular block, second degree: Secondary | ICD-10-CM

## 2021-06-16 DIAGNOSIS — R001 Bradycardia, unspecified: Secondary | ICD-10-CM

## 2021-06-16 HISTORY — PX: PACEMAKER IMPLANT: EP1218

## 2021-06-16 LAB — BASIC METABOLIC PANEL
Anion gap: 7 (ref 5–15)
BUN: 21 mg/dL (ref 8–23)
CO2: 24 mmol/L (ref 22–32)
Calcium: 9 mg/dL (ref 8.9–10.3)
Chloride: 108 mmol/L (ref 98–111)
Creatinine, Ser: 1.19 mg/dL (ref 0.61–1.24)
GFR, Estimated: >60 mL/min (ref >60)
Glucose, Bld: 98 mg/dL (ref 70–99)
Potassium: 4.2 mmol/L (ref 3.5–5.1)
Sodium: 139 mmol/L (ref 135–145)

## 2021-06-16 SURGERY — PACEMAKER IMPLANT

## 2021-06-16 MED ORDER — SODIUM CHLORIDE 0.9 % IV SOLN
INTRAVENOUS | Status: AC
  Filled 2021-06-16: qty 2

## 2021-06-16 MED ORDER — LIDOCAINE HCL (PF) 1 % IJ SOLN
INTRAMUSCULAR | Status: DC | PRN
Start: 2021-06-16 — End: 2021-06-16
  Administered 2021-06-16: 60 mL

## 2021-06-16 MED ORDER — AMLODIPINE BESYLATE 5 MG PO TABS
5.0000 mg | ORAL_TABLET | Freq: Every day | ORAL | Status: DC
  Administered 2021-06-16: 5 mg via ORAL
  Filled 2021-06-16: qty 1

## 2021-06-16 MED ORDER — HEPARIN (PORCINE) IN NACL 1000-0.9 UT/500ML-% IV SOLN
INTRAVENOUS | Status: DC | PRN
  Administered 2021-06-16: 500 mL

## 2021-06-16 MED ORDER — LIDOCAINE HCL 1 % IJ SOLN
INTRAMUSCULAR | Status: AC
  Filled 2021-06-16: qty 20

## 2021-06-16 MED ORDER — MIDAZOLAM HCL 5 MG/5ML IJ SOLN
INTRAMUSCULAR | Status: AC
  Filled 2021-06-16: qty 5

## 2021-06-16 MED ORDER — ONDANSETRON HCL 4 MG/2ML IJ SOLN: 4.0000 mg | Freq: Four times a day (QID) | INTRAMUSCULAR | Status: DC | PRN

## 2021-06-16 MED ORDER — HEPARIN (PORCINE) IN NACL 1000-0.9 UT/500ML-% IV SOLN
INTRAVENOUS | Status: AC
  Filled 2021-06-16: qty 500

## 2021-06-16 MED ORDER — LABETALOL HCL 5 MG/ML IV SOLN
10.0000 mg | Freq: Four times a day (QID) | INTRAVENOUS | Status: DC | PRN
  Administered 2021-06-16: 10 mg via INTRAVENOUS
  Filled 2021-06-16: qty 4

## 2021-06-16 MED ORDER — FENTANYL CITRATE (PF) 100 MCG/2ML IJ SOLN
INTRAMUSCULAR | Status: AC
  Filled 2021-06-16: qty 2

## 2021-06-16 MED ORDER — CEFAZOLIN SODIUM-DEXTROSE 2-4 GM/100ML-% IV SOLN
INTRAVENOUS | Status: AC
  Filled 2021-06-16: qty 100

## 2021-06-16 MED ORDER — ACETAMINOPHEN 325 MG PO TABS
325.0000 mg | ORAL_TABLET | ORAL | Status: DC | PRN
  Administered 2021-06-16: 325 mg via ORAL
  Administered 2021-06-17: 650 mg via ORAL
  Filled 2021-06-16: qty 1
  Filled 2021-06-16: qty 2

## 2021-06-16 MED ORDER — MIDAZOLAM HCL 5 MG/5ML IJ SOLN
INTRAMUSCULAR | Status: DC | PRN
  Administered 2021-06-16: 2 mg via INTRAVENOUS

## 2021-06-16 MED ORDER — FENTANYL CITRATE (PF) 100 MCG/2ML IJ SOLN
INTRAMUSCULAR | Status: DC | PRN
Start: 2021-06-16 — End: 2021-06-16
  Administered 2021-06-16: 25 ug via INTRAVENOUS

## 2021-06-16 MED ORDER — CEFAZOLIN SODIUM-DEXTROSE 1-4 GM/50ML-% IV SOLN
1.0000 g | Freq: Four times a day (QID) | INTRAVENOUS | Status: AC
  Administered 2021-06-16 – 2021-06-17 (×3): 1 g via INTRAVENOUS
  Filled 2021-06-16 (×3): qty 50

## 2021-06-16 SURGICAL SUPPLY — 12 items
CABLE SURGICAL S-101-97-12 (CABLE) ×2 IMPLANT
CATH HIS SELECTSITE C304HIS (CATHETERS) ×1 IMPLANT
IPG PACE AZUR XT DR MRI W1DR01 (Pacemaker) IMPLANT
LEAD CAPSURE NOVUS 5076-52CM (Lead) ×1 IMPLANT
LEAD SELECT SECURE 3830 383069 (Lead) IMPLANT
PACE AZURE XT DR MRI W1DR01 (Pacemaker) ×2 IMPLANT
PAD DEFIB RADIO PHYSIO CONN (PAD) ×2 IMPLANT
SELECT SECURE 3830 383069 (Lead) ×2 IMPLANT
SHEATH 7FR PRELUDE SNAP 13 (SHEATH) ×2 IMPLANT
SLITTER 6232ADJ (MISCELLANEOUS) ×1 IMPLANT
TRAY PACEMAKER INSERTION (PACKS) ×2 IMPLANT
WIRE HI TORQ VERSACORE-J 145CM (WIRE) ×1 IMPLANT

## 2021-06-16 NOTE — Progress Notes (Signed)
Subjective:  ?No symptoms at rest ?Ambulating in hallway with assistance without complaints.  ? ?S/p mid RCA PCI on 06/12/2021 ? ? ?Objective:  ?Vital Signs in the last 24 hours: ?Temp:  [98 ?F (36.7 ?C)-98.8 ?F (37.1 ?C)] 98.2 ?F (36.8 ?C) (04/11 0755) ?Pulse Rate:  [40-79] 40 (04/11 0755) ?Resp:  [16-20] 20 (04/11 0755) ?BP: (130-152)/(63-83) 130/72 (04/11 0755) ?SpO2:  [98 %] 98 % (04/11 0524) ?Weight:  [83.8 kg] 83.8 kg (04/11 0520) ? ?Intake/Output from previous day: ?No intake/output data recorded. ? ?Physical Exam ?Vitals and nursing note reviewed.  ?Constitutional:   ?   General: He is not in acute distress. ?Neck:  ?   Vascular: No JVD.  ?Cardiovascular:  ?   Rate and Rhythm: Regular rhythm. Bradycardia present.  ?   Heart sounds: Normal heart sounds. No murmur heard. ?Pulmonary:  ?   Effort: Pulmonary effort is normal.  ?   Breath sounds: Normal breath sounds. No wheezing or rales.  ?Musculoskeletal:  ?   Right lower leg: No edema.  ?   Left lower leg: No edema.  ? ? ? ?Imaging/tests reviewed and independently interpreted: ? ?Cardiac Studies: ? ?Telemetry 06/16/2021: ?1:1 AV conduction interspersed with second degree type 1 AV block, occasional third degree AV block ?EKG 06/12/2021: ?Sinus rhythm with 2: 1 AV block  ?Right bundle branch block ? ? ?Echocardiogram 06/12/2021: ? 1. Left ventricular ejection fraction, by estimation, is 45 to 50%. The  ?left ventricle has mildly decreased function. The left ventricle  ?demonstrates global hypokinesis. The left ventricular internal cavity size  ?was mildly dilated. not assessed due to  ?2:1 AV block.  ? 2. Right ventricular systolic function is normal. The right ventricular  ?size is normal.  ? 3. The mitral valve is grossly normal. Mild to moderate mitral valve  ?regurgitation.  ? 4. The aortic valve is normal in structure. Aortic valve regurgitation is  ?mild. No aortic stenosis is present.  ? ?Comparison(s): A prior study was performed on 2011. AV block is new.   ?Aortic regurgitation is new. LA dilatation is not seen.  ? ?Coronary intervention 06/12/2021: ?LM: Normal ?LAD: Normal ?Lcx: Mild mid Lcx disease ?RCA: Large, dominant vessel ?        Mid 80% stenosis ?  ?This was felt to be possible etiology for symptomatic ischemic AV conduction disease ?        ?       Successful percutaneous coronary intervention mRCA ?       PTCA and overlapping stent placement 4.0X18 & 4.0X15 mm Onyx Frontier drug-eluting stents ?       Second proximal stent necessitated by proximal edge dissection propagating into prox RCA ?       Post dilatation with 4.0X12 mm [place] balloon up to 22 atm ?  ?Monitor next 24-48 hours to assess rhythm and symptoms to then decide whether patient would need permanent pacemaker. Appreciate EP input.  ?  ?CT cardiac scoring 12/24/2020: ?LM: 0 ?LAD: 55 ?Lcx: 0 ?RCA: 341 ?Total: 341 (68th percentile) ?  ?CCTA 2011: ?1)    Calcium score 150  ?2)    No critical CAD.  < 30% calcified plaque in small OM2.  Less  ?than 50% calcified plaque in proximal RCA.  ?  ?CMRI 2011: ?Findings:  ? ?There was mild LVE with mild diffuse hypokinesis slightly worse in  ?the posterior basal region.  LA/RA and RV were normal.  No ASD or  ?VSD.  No pericardial effusion.  Aortic and mitral valves  ?structurally normal  ? ?EDV 170cc  ?ESV 83 cc  ?SV: 87 cc  ? ?EF: 51%  ? ?Impression: Mild LVE with low normal EF 51%  ?Findings:  ? ?There was mild LVE with mild diffuse hypokinesis slightly worse in  ?the posterior basal region.  LA/RA and RV were normal.  No ASD or  ?VSD.  No pericardial effusion. Aortic and mitral valves  ?structurally normal  ? ?EDV 170cc  ?ESV 83 cc  ?SV: 87 cc  ? ?EF: 51%  ? ?Impression: Mild LVE with low normal EF 51%  ?  ?Echocardiogram 2011: ?- Left ventricle: The cavity size was at the upper limits of normal.  ?   Systolic function was mildly reduced. The estimated ejection  ?   fraction was in the range of 45% to 50%. There is hypokinesis of  ?   the mid-distal lateral and  inferolateral myocardium. Doppler  ?   parameters are consistent with abnormal left ventricular  ?   relaxation (grade 1 diastolic dysfunction).  ? - Left atrium: The atrium was moderately dilated.  ? ?Assessment & Recommendations: ? ?71 y.o. Caucasian male  with hypertension, hyperlipidemia, elevated coronary calcium score, admitted with lightheadedness, dyspnea on exertion, AV block ?  ?AV block: ?Underlying AV conduction disease with RBBB, that was probably worsened by ischemia.  ?He has had some improvement in AV conduction after RCA PCI, with definite 1:1 AV conduction at times, but also has periods of second degree type 2 and third degree AV block. ?I agree that pacemaker will still be recommended as backup for high grade AV blocks as above. ?Plan for today.Marland Kitchen ?Continue DAPT with Aspirin, plavix given PCI on 06/12/2021. ?To reduce bleeding risk, not on SQ lovenox or heparin. ? ?CAD: ?Severe mid RCA stenosis s/p successful PCI with overlapping stents 4.0 x 18, and 4.0 x 50 mm Onyx stents. ?Continue aspirin and Plavix for at least 6 months. ?Continue Crestor 20 mg. ?No beta-blockers given AV block. ? ?Anticipate discharge on 4/12 or 4/13. ? ? ?Nigel Mormon, MD ?Pager: [phone] ?Office: [phone] ? ?

## 2021-06-16 NOTE — Progress Notes (Signed)
Patient returned from cath lab at  1523hrs. Left arm in sling, pressure dressing to left chest CDI.  Reviewed post pacer instructions, patient verbalized understanding.   ?

## 2021-06-16 NOTE — Discharge Instructions (Addendum)
? ? ?  Supplemental Discharge Instructions for  ?Pacemaker/Defibrillator Patients ? ? ?Activity ?No heavy lifting or vigorous activity with your left/right arm for 6 to 8 weeks.  Do not raise your left/right arm above your head for one week.  Gradually raise your affected arm as drawn below. ? ?        ?   06/21/21                     06/22/21                   06/23/21                   06/24/21 ?__ ? ?NO DRIVING until cleared to at your wound check visit  . ? ?WOUND CARE ?Keep the wound area clean and dry.  Do not get this area wet , no showers until cleared to at your wound check visit ?The tape/steri-strips on your wound will fall off; do not pull them off.  No bandage is needed on the site.  DO  NOT apply any creams, oils, or ointments to the wound area. ?If you notice any drainage or discharge from the wound, any swelling or bruising at the site, or you develop a fever > 101? F after you are discharged home, call the office at once. ? ?Special Instructions ?You are still able to use cellular telephones; use the ear opposite the side where you have your pacemaker/defibrillator.  Avoid carrying your cellular phone near your device. ?When traveling through airports, show security personnel your identification card to avoid being screened in the metal detectors.  Ask the security personnel to use the hand wand. ?Avoid arc welding equipment, MRI testing (magnetic resonance imaging), TENS units (transcutaneous nerve stimulators).  Call the office for questions about other devices. ?Avoid electrical appliances that are in poor condition or are not properly grounded. ?Microwave ovens are safe to be near or to operate. ? ? ?

## 2021-06-16 NOTE — Progress Notes (Signed)
Patient to Cath lab at 1330hrs.  ?

## 2021-06-16 NOTE — Progress Notes (Addendum)
? ?Progress Note ? ?Patient Name: Keith Jones ?Date of Encounter: 06/16/2021 ? ?[facility] Cardiologist: Dr. Veneda Melter ? ?Subjective  ? ?Feels "fine" no CP, no SOB, no dizziness, no new complaints ? ?Inpatient Medications  ?  ?Scheduled Meds: ? aspirin EC  81 mg Oral Daily  ? chlorhexidine  60 mL Topical Once  ? clopidogrel  75 mg Oral Q breakfast  ? docusate sodium  200 mg Oral Daily  ? folic acid  1 mg Oral Daily  ? gentamicin irrigation  80 mg Irrigation On Call  ? multivitamin with minerals  1 tablet Oral Daily  ? rosuvastatin  20 mg Oral Daily  ? sodium chloride flush  3 mL Intravenous Q12H  ? thiamine  100 mg Oral Daily  ? Or  ? thiamine  100 mg Intravenous Daily  ? ?Continuous Infusions: ? sodium chloride    ? sodium chloride    ? sodium chloride    ?  ceFAZolin (ANCEF) IV    ? ?PRN Meds: ?sodium chloride, acetaminophen, ondansetron (ZOFRAN) IV, sodium chloride flush, sodium chloride flush, zolpidem  ? ?Vital Signs  ?  ?Vitals:  ? 06/15/21 1204 06/15/21 2047 06/16/21 0520 06/16/21 0524  ?BP: (!) 142/83 (!) 152/63  (!) 142/72  ?Pulse: (!) 42 (!) 42  79  ?Resp: $Remov'16 16  16  'bWEmgX$ ?Temp: 98 ?F (36.7 ?C) 98.8 ?F (37.1 ?C)  98.4 ?F (36.9 ?C)  ?TempSrc: Oral Oral  Oral  ?SpO2:  98%  98%  ?Weight:   83.8 kg   ?Height:      ? ?No intake or output data in the 24 hours ending 06/16/21 0758 ? ? ?  06/16/2021  ?  5:20 AM 06/15/2021  ?  4:12 AM 06/14/2021  ?  5:58 AM  ?Last 3 Weights  ?Weight (lbs) 184 lb 12.8 oz 188 lb 6.4 oz 185 lb 11.2 oz  ?Weight (kg) 83.825 kg 85.458 kg 84.233 kg  ?   ? ?Telemetry  ?  ?Mostly 2:1AV block, has some 1:1 intermittently, rates generally about 40 in heart block, some 30's- Personally Reviewed ? ?ECG  ?  ?No new EKGs - Personally Reviewed ? ?Physical Exam  ? ?unchanged ?GEN: No acute distress.   ?Neck: No JVD ?Cardiac: RRR, bradycardic, no murmurs, rubs, or gallops.  ?Respiratory: CTA b/l. ?GI: Soft, nontender, non-distended  ?MS: No edema; No deformity. ?Neuro:  Nonfocal  ?Psych: Normal affect   ? ?Labs  ?  ?High Sensitivity Troponin:   ?Recent Labs  ?Lab 06/11/21 ?1806 06/11/21 ?2042  ?TROPONINIHS 13 16  ?   ?Chemistry ?Recent Labs  ?Lab 06/14/21 ?6606 06/15/21 ?0429 06/16/21 ?0209  ?NA 139 138 139  ?K 3.4* 4.0 4.2  ?CL 109 108 108  ?CO2 $Rem'23 24 24  'FYOc$ ?GLUCOSE 101* 100* 98  ?BUN $Rem'17 19 21  'nTna$ ?CREATININE 1.16 1.22 1.19  ?CALCIUM 8.5* 8.7* 9.0  ?GFRNONAA >60 >60 >60  ?ANIONGAP $RemoveBe'7 6 7  'EfqiJKTqb$ ?  ?Lipids  ?Recent Labs  ?Lab 06/12/21 ?1838  ?CHOL 135  ?TRIG 57  ?HDL 53  ?[place] 71  ?CHOLHDL 2.5  ?  ?Hematology ?Recent Labs  ?Lab 06/11/21 ?1806 06/13/21 ?0304  ?WBC 6.4 9.0  ?RBC 4.88 4.77  ?HGB 14.7 14.1  ?HCT 43.0 41.6  ?MCV 88.1 87.2  ?MCH 30.1 29.6  ?MCHC 34.2 33.9  ?RDW 12.5 13.0  ?PLT 188 191  ? ?Thyroid  ?Recent Labs  ?Lab 06/11/21 ?1806  ?TSH 2.254  ?  ?BNP ?Recent Labs  ?Lab 06/11/21 ?  1806  ?BNP 299.8*  ?  ?DDimer No results for input(s): DDIMER in the last 168 hours.  ? ?Radiology  ?  ?No results found. ? ?Cardiac Studies  ? ? ?06/12/2021: LHC/PCI ?LM: Normal ?LAD: Normal ?Lcx: Mild mid Lcx disease ?RCA: Large, dominant vessel ?        Mid 80% stenosis ?  ?This was felt to be possible etiology for symptomatic ischemic AV conduction disease ?        ?       Successful percutaneous coronary intervention mRCA ?       PTCA and overlapping stent placement 4.0X18 & 4.0X15 mm Onyx Frontier drug-eluting stents ?       Second proximal stent necessitated by proximal edge dissection propagating into prox RCA ?       Post dilatation with 4.0X12 mm [place] balloon up to 22 atm ?  ?Monitor next 24-48 hours to assess rhythm and symptoms to then decide whether patient would need permanent pacemaker. Appreciate EP input.  ? ? ?06/12/2021: TTE ? 1. Left ventricular ejection fraction, by estimation, is 45 to 50%. The  ?left ventricle has mildly decreased function. The left ventricle  ?demonstrates global hypokinesis. The left ventricular internal cavity size  ?was mildly dilated. not assessed due to  ?2:1 AV block.  ? 2. Right ventricular  systolic function is normal. The right ventricular  ?size is normal.  ? 3. The mitral valve is grossly normal. Mild to moderate mitral valve  ?regurgitation.  ? 4. The aortic valve is normal in structure. Aortic valve regurgitation is  ?mild. No aortic stenosis is present.  ? ?Comparison(s): A prior study was performed on 2011. AV block is new.  ?Aortic regurgitation is new. LA dilatation is not seen.  ? ?Patient Profile  ?   ?71 y.o. male without PMHx of variable AV conduction in 2011 during a prostatectomy procedure.  Seen at that time and Echo showed mild basal inferior hypokinesis with EF of 45%. cMRI performed showed HK, but mild HK of slightly worse in posterior basal region with EF 51%. He did have noncritical CAD with calcium score 150 back then.  More recently, CT cardiac scoring showed calcium score of 68th percentile, predominantly in RCA and LAD, RBBB, bifascicular block admitted with symptomatic bradycardia, found in 2:1 AVBlock ? ?No syncope ?HS Trop x2 negative ?Cath with CAD > PCI to RCA ?LVEF 45-50% ? ?Assessment & Plan  ?  ?1.  Second degree AV block ?Continues intermittently, mostly  ?Despite RCA disease/intervention ?Longstanding conduction system disease, RBBB he states goes back to when he was 17 ?no MI/ACS HS Trop were 13 and 16 ? ?ASA/Plavix increases bleeding risk, pt aware ?Re discussed procedure, potential risks/benefits, he remains agreeable ?Dr. Lovena Le has seen the patient this AM, discussed rational for pacing and the procedure with thept and his wife (on the phone) ?Will proceed today ?  ?2. CAD ?Cath as above ?RCA stent ?C/w Dr. Nadean Corwin ? ?For questions or updates, please contact [place] ?Please consult [email or web address] for contact info under  ? ?   ?Signed, ?Renee Dyane Dustman, PA-C  ?06/16/2021, 7:58 AM   ? ?EP Attending ? ?Patient seen and examined. Agree with the problems as noted above. He has mostly 2:1 AV block. I have reviewed the  indications/risks/benefits/goals/expectations and he wishes to proceed with DDD PM insertion. ? ?Keith, Jones ? ? ?

## 2021-06-17 ENCOUNTER — Other Ambulatory Visit (HOSPITAL_COMMUNITY): Payer: Self-pay

## 2021-06-17 ENCOUNTER — Inpatient Hospital Stay (HOSPITAL_COMMUNITY): Payer: Medicare HMO

## 2021-06-17 DIAGNOSIS — J9811 Atelectasis: Secondary | ICD-10-CM | POA: Diagnosis not present

## 2021-06-17 DIAGNOSIS — I4430 Unspecified atrioventricular block: Secondary | ICD-10-CM | POA: Diagnosis not present

## 2021-06-17 LAB — BASIC METABOLIC PANEL
Anion gap: 8 (ref 5–15)
BUN: 15 mg/dL (ref 8–23)
CO2: 23 mmol/L (ref 22–32)
Calcium: 9 mg/dL (ref 8.9–10.3)
Chloride: 107 mmol/L (ref 98–111)
Creatinine, Ser: 1.04 mg/dL (ref 0.61–1.24)
GFR, Estimated: >60 mL/min (ref >60)
Glucose, Bld: 97 mg/dL (ref 70–99)
Potassium: 3.4 mmol/L — ABNORMAL LOW (ref 3.5–5.1)
Sodium: 138 mmol/L (ref 135–145)

## 2021-06-17 MED ORDER — POTASSIUM CHLORIDE CRYS ER 20 MEQ PO TBCR
40.0000 meq | EXTENDED_RELEASE_TABLET | Freq: Once | ORAL | Status: AC
  Administered 2021-06-17: 40 meq via ORAL
  Filled 2021-06-17: qty 2

## 2021-06-17 MED ORDER — AMLODIPINE BESYLATE 10 MG PO TABS: 10.0000 mg | ORAL_TABLET | Freq: Every day | ORAL | 0 refills | Status: DC

## 2021-06-17 MED ORDER — ASPIRIN 81 MG PO TBEC
81.0000 mg | DELAYED_RELEASE_TABLET | Freq: Every day | ORAL | 2 refills | Status: DC
  Filled 2021-06-17: qty 30, 30d supply, fill #0

## 2021-06-17 MED ORDER — NITROGLYCERIN 0.4 MG SL SUBL
0.4000 mg | SUBLINGUAL_TABLET | SUBLINGUAL | 2 refills | Status: AC | PRN
  Filled 2021-06-17: qty 25, 1d supply, fill #0

## 2021-06-17 MED ORDER — AMLODIPINE BESYLATE 10 MG PO TABS: 10.0000 mg | ORAL_TABLET | Freq: Every day | ORAL | 2 refills | Status: DC

## 2021-06-17 MED ORDER — AMLODIPINE BESYLATE 10 MG PO TABS
10.0000 mg | ORAL_TABLET | Freq: Every day | ORAL | Status: DC
  Administered 2021-06-17: 10 mg via ORAL
  Filled 2021-06-17: qty 1

## 2021-06-17 MED ORDER — AMLODIPINE BESYLATE 10 MG PO TABS
10.0000 mg | ORAL_TABLET | Freq: Every day | ORAL | 2 refills | Status: DC
  Filled 2021-06-17: qty 30, 30d supply, fill #0

## 2021-06-17 MED ORDER — ROSUVASTATIN CALCIUM 20 MG PO TABS
20.0000 mg | ORAL_TABLET | Freq: Every day | ORAL | 2 refills | Status: DC
  Filled 2021-06-17: qty 30, 30d supply, fill #0

## 2021-06-17 MED ORDER — ASPIRIN 81 MG PO TBEC
81.0000 mg | DELAYED_RELEASE_TABLET | Freq: Every day | ORAL | 3 refills | Status: DC
  Filled 2021-06-17: qty 30, 30d supply, fill #0

## 2021-06-17 MED ORDER — CLOPIDOGREL BISULFATE 75 MG PO TABS
75.0000 mg | ORAL_TABLET | Freq: Every day | ORAL | 2 refills | Status: DC
  Filled 2021-06-17: qty 30, 30d supply, fill #0

## 2021-06-17 MED FILL — Lidocaine HCl Local Inj 1%: INTRAMUSCULAR | Qty: 60 | Status: AC

## 2021-06-17 NOTE — Progress Notes (Signed)
CARDIAC REHAB PHASE I  ? ?Pt ambulating independently without difficulty. Reinforced importance of ASA and Plavix. Reviewed site care, restrictions, and exercise guidelines. Will refer to CRP II [place]. ? ? ?[phone] ?Rufina Falco, RN BSN ?06/17/2021 ?9:39 AM ? ?

## 2021-06-17 NOTE — Progress Notes (Addendum)
? ?Progress Note ? ?Patient Name: Keith Jones ?Date of Encounter: 06/17/2021 ? ?[facility] Cardiologist: Dr. Veneda Melter ? ?Subjective  ? ?no CP, no SOB, no dizziness, mild post-op discomfort at pacer site ? ?Inpatient Medications  ?  ?Scheduled Meds: ? amLODipine  10 mg Oral Daily  ? aspirin EC  81 mg Oral Daily  ? clopidogrel  75 mg Oral Q breakfast  ? docusate sodium  200 mg Oral Daily  ? folic acid  1 mg Oral Daily  ? multivitamin with minerals  1 tablet Oral Daily  ? potassium chloride  40 mEq Oral Once  ? rosuvastatin  20 mg Oral Daily  ? sodium chloride flush  3 mL Intravenous Q12H  ? thiamine  100 mg Oral Daily  ? Or  ? thiamine  100 mg Intravenous Daily  ? ?Continuous Infusions: ? sodium chloride    ?  ceFAZolin (ANCEF) IV 1 g (06/17/21 5361)  ? ?PRN Meds: ?sodium chloride, acetaminophen, labetalol, ondansetron (ZOFRAN) IV, sodium chloride flush, zolpidem  ? ?Vital Signs  ?  ?Vitals:  ? 06/16/21 2047 06/17/21 0014 06/17/21 0428 06/17/21 0848  ?BP: (!) 162/87 (!) 151/82 (!) 142/87 (!) 163/98  ?Pulse: 77 76 73 91  ?Resp:  _0 ?Temp:  98.1 ?F (36.7 ?C) 98.2 ?F (36.8 ?C) 98.4 ?F (36.9 ?C)  ?TempSrc:  Oral Oral Oral  ?SpO2: 100% 96% 95% 99%  ?Weight:      ?Height:      ? ? ?Intake/Output Summary (Last 24 hours) at 06/17/2021 0855 ?Last data filed at 06/17/2021 0307 ?Gross per 24 hour  ?Intake 1211.18 ml  ?Output --  ?Net 1211.18 ml  ? ? ? ?  06/16/2021  ?  5:20 AM 06/15/2021  ?  4:12 AM 06/14/2021  ?  5:58 AM  ?Last 3 Weights  ?Weight (lbs) 184 lb 12.8 oz 188 lb 6.4 oz 185 lb 11.2 oz  ?Weight (kg) 83.825 kg 85.458 kg 84.233 kg  ?   ? ?Telemetry  ?  ?SR/V paced - Personally Reviewed ? ?ECG  ?  ?SR/VP 69bpm, QRS 172m - Personally Reviewed ? ?Physical Exam  ? ? ?GEN: No acute distress.   ?Neck: No JVD ?Cardiac: RRR, no murmurs, rubs, or gallops.  ?Respiratory: CTA b/l. ?GI: Soft, nontender, non-distended  ?MS: No edema; No deformity. ?Neuro:  Nonfocal  ?Psych: Normal affect  ? ?PPM site is stable, no bleeding  or hematoma ? ?Labs  ?  ?High Sensitivity Troponin:   ?Recent Labs  ?Lab 06/11/21 ?1806 06/11/21 ?2042  ?TROPONINIHS 13 16  ?   ?Chemistry ?Recent Labs  ?Lab 06/15/21 ?0429 06/16/21 ?0209 06/17/21 ?0313  ?NA 138 139 138  ?K 4.0 4.2 3.4*  ?CL 108 108 107  ?CO2 _1 ?GLUCOSE 100* 98 97  ?BUN _2 ?CREATININE 1.22 1.19 1.04  ?CALCIUM 8.7* 9.0 9.0  ?GFRNONAA >60 >60 >60  ?ANIONGAP _3 ?  ?Lipids  ?Recent Labs  ?Lab 06/12/21 ?1838  ?CHOL 135  ?TRIG 57  ?HDL 53  ?[id number]  ?CHOLHDL 2.5  ?  ?Hematology ?Recent Labs  ?Lab 06/11/21 ?1806 06/13/21 ?0304  ?WBC 6.4 9.0  ?RBC 4.88 4.77  ?HGB 14.7 14.1  ?HCT 43.0 41.6  ?MCV 88.1 87.2  ?MCH 30.1 29.6  ?MCHC 34.2 33.9  ?RDW 12.5 13.0  ?PLT 188 191  ? ?Thyroid  ?Recent Labs  ?Lab 06/11/21 ?1806  ?TSH 2.254  ?  ?BNP ?Recent Labs  ?  Lab 06/11/21 ?1806  ?BNP 299.8*  ?  ?DDimer No results for input(s): DDIMER in the last 168 hours.  ? ?Radiology  ?  ?DG Chest 2 View ? ?Result Date: 06/17/2021 ?CLINICAL DATA:  Post pacemaker. EXAM: CHEST - 2 VIEW COMPARISON:  06/11/2021. FINDINGS: The heart size and mediastinal contours are within normal limits. There is atherosclerotic calcification of the aorta. Mild atelectasis is noted at the left lung base. No effusion or pneumothorax. Dual lead pacemaker is present over the left chest. No acute osseous abnormality is identified. IMPRESSION: 1. Left dual lead pacemaker.  No pneumothorax. 2. Mild atelectasis at the left lung base. Electronically Signed   By: Brett Fairy M.D.   On: 06/17/2021 04:13  ? ?EP PPM/ICD IMPLANT ? ?Result Date: 06/16/2021 ?CONCLUSIONS:  1. Successful implantation of a medtronic dual-chamber pacemaker for symptomatic bradycardia due to 2:1 AV block  2. No early apparent complications.       Cristopher Peru, MD 06/16/2021 4:05 PM    ? ?Cardiac Studies  ? ? ?06/12/2021: LHC/PCI ?LM: Normal ?LAD: Normal ?Lcx: Mild mid Lcx disease ?RCA: Large, dominant vessel ?        Mid 80% stenosis ?  ?This was felt to be possible  etiology for symptomatic ischemic AV conduction disease ?        ?       Successful percutaneous coronary intervention mRCA ?       PTCA and overlapping stent placement 4.0X18 & 4.0X15 mm Onyx Frontier drug-eluting stents ?       Second proximal stent necessitated by proximal edge dissection propagating into prox RCA ?       Post dilatation with 4.0X12 mm [place] balloon up to 22 atm ?  ?Monitor next 24-48 hours to assess rhythm and symptoms to then decide whether patient would need permanent pacemaker. Appreciate EP input.  ? ? ?06/12/2021: TTE ? 1. Left ventricular ejection fraction, by estimation, is 45 to 50%. The  ?left ventricle has mildly decreased function. The left ventricle  ?demonstrates global hypokinesis. The left ventricular internal cavity size  ?was mildly dilated. not assessed due to  ?2:1 AV block.  ? 2. Right ventricular systolic function is normal. The right ventricular  ?size is normal.  ? 3. The mitral valve is grossly normal. Mild to moderate mitral valve  ?regurgitation.  ? 4. The aortic valve is normal in structure. Aortic valve regurgitation is  ?mild. No aortic stenosis is present.  ? ?Comparison(s): A prior study was performed on 2011. AV block is new.  ?Aortic regurgitation is new. LA dilatation is not seen.  ? ?Patient Profile  ?   ?71 y.o. male without PMHx of variable AV conduction in 2011 during a prostatectomy procedure.  Seen at that time and Echo showed mild basal inferior hypokinesis with EF of 45%. cMRI performed showed HK, but mild HK of slightly worse in posterior basal region with EF 51%. He did have noncritical CAD with calcium score 150 back then.  More recently, CT cardiac scoring showed calcium score of 68th percentile, predominantly in RCA and LAD, RBBB, bifascicular block admitted with symptomatic bradycardia, found in 2:1 AVBlock ? ?No syncope ?HS Trop x2 negative ?Cath with CAD > PCI to RCA ?LVEF 45-50% ? ?Assessment & Plan  ?  ?1.  Second degree AV block ?Continues  intermittently, mostly  ?Despite RCA disease/intervention ?Longstanding conduction system disease, RBBB he states goes back to when he was 17 ?no MI/ACS HS Trop were 13 and  16 ? ?S/p PPM yesterday ?Site is stable ?CXR without ptx ?Device check with stable measurements ?Wound care and arm restrictions reviewed with the patient ?EP follow up is in place ?Dr. Curt Bears has seen the patient OK to discharge when ready medically otherwise ?  ?2. CAD ?Cath as above ?RCA stent ?C/w Dr. Nadean Corwin ? ?For questions or updates, please contact [place] ?Please consult [email or web address] for contact info under  ? ?   ?Signed, ?Renee Dyane Dustman, PA-C  ?06/17/2021, 8:55 AM   ? ? ?I have seen and examined this patient with Tommye Standard.  Agree with above, note added to reflect my findings.  On exam, RRR, no murmurs, lungs clear.  She is now status post Medtronic dual-chamber pacemaker implanted for intermittent 2-1 AV block..  Device functioning appropriately.  Chest x-ray and interrogation without issue.  Okay for discharge today with follow-up in device clinic. ? ?Jaella Weinert M. Naela Nodal MD ?06/17/2021 ?6:42 PM ? ?

## 2021-06-17 NOTE — Discharge Summary (Signed)
Physician Discharge Summary  ?Patient ID: ?Keith Jones ?MRN: [id number] ?DOB/AGE: 71-16-1952 71 y.o. ? ?Admit date: 06/11/2021 ?Discharge date: 06/17/2021 ? ?Primary Discharge Diagnosis: ?High grade AV block ?Coronary artery disease with angina equivalent, unstable ? ? ?Secondary Discharge Diagnosis: ?Hypertension ? ? ?Hospital Course:  ? ?71 y.o. caucasian male  with CAD s/p RCA PCI for severe stenosis, presentation with exertional dyspnea and lightheadedness, AV block-likely combination of degenerative and ischemic, now s/p PPM placement ? ?Severe CAD was suspected given ST changes and recent CT cardiac scoring showing high calcium score particularly in RCA, He underwent successful PCI with two overlapping stents in large dominant mid-prox RCA with excellent results. ? ?While his AV conduction did improve 2 days after PCI with periods of 1:1 AV conduction, he still continued to have intermittent high grade AV block. Therefore, he underwent PPM placement on 06/16/2021.  ?No hematoma post procedure, ? ?Patient will be discharged on DAPT with Aspirin, plavix, increased dose of Crestor 20 mg, increase amlodipine 10 mg for better BP control.  ? ?F/u arranged with EP and me in two weeks ? ?Discharge Exam: ?Blood pressure (!) 163/98, pulse 91, temperature 98.4 ?F (36.9 ?C), temperature source Oral, resp. rate 16, height 6' (1.829 m), weight 83.8 kg, SpO2 99 %.  ? ?Physical Exam ?Vitals and nursing note reviewed.  ?Constitutional:   ?   General: He is not in acute distress. ?Neck:  ?   Vascular: No JVD.  ?Cardiovascular:  ?   Rate and Rhythm: Normal rate and regular rhythm.  ?   Heart sounds: Normal heart sounds. No murmur heard. ?Pulmonary:  ?   Effort: Pulmonary effort is normal.  ?   Breath sounds: Normal breath sounds. No wheezing or rales.  ?Chest:  ?   Comments: Pacemaker site with no hematoma ?Musculoskeletal:  ?   Right lower leg: No edema.  ?   Left lower leg: No edema.  ? ? ?.phy ? ?Significant Diagnostic  Studies: ? ?EKG 06/17/2021: ?Sinus rhythm ?Ventricular paced rhythm ? ?Pacemaker implant 06/16/2021: ?1. Successful implantation of a medtronic dual-chamber pacemaker for symptomatic bradycardia due to 2:1 AV block ? 2. No early apparent complications.  ?   ? ?Coronary intervention 06/12/2021: ?LM: Normal ?LAD: Normal ?Lcx: Mild mid Lcx disease ?RCA: Large, dominant vessel ?        Mid 80% stenosis ?  ?This was felt to be possible etiology for symptomatic ischemic AV conduction disease ?        ?       Successful percutaneous coronary intervention mRCA ?       PTCA and overlapping stent placement 4.0X18 & 4.0X15 mm Onyx Frontier drug-eluting stents ?       Second proximal stent necessitated by proximal edge dissection propagating into prox RCA ?       Post dilatation with 4.0X12 mm [place] balloon up to 22 atm ?  ?Echocardiogram 06/12/2021: ? 1. Left ventricular ejection fraction, by estimation, is 45 to 50%. The  ?left ventricle has mildly decreased function. The left ventricle  ?demonstrates global hypokinesis. The left ventricular internal cavity size  ?was mildly dilated. not assessed due to  ?2:1 AV block.  ? 2. Right ventricular systolic function is normal. The right ventricular  ?size is normal.  ? 3. The mitral valve is grossly normal. Mild to moderate mitral valve  ?regurgitation.  ? 4. The aortic valve is normal in structure. Aortic valve regurgitation is  ?mild. No aortic stenosis is present.  ? ?  Comparison(s): A prior study was performed on 2011. AV block is new.  ?Aortic regurgitation is new. LA dilatation is not seen.  ? ?FOLLOW UP PLANS AND APPOINTMENTS ?Discharge Instructions   ? ? Amb Referral to Cardiac Rehabilitation   Complete by: As directed ?  ? Diagnosis:  Coronary Stents ?PTCA  ?  ? After initial evaluation and assessments completed: Virtual Based Care may be provided alone or in conjunction with Phase 2 Cardiac Rehab based on patient barriers.: Yes  ? Diet - low sodium heart healthy   Complete by: As  directed ?  ? Discharge wound care:   Complete by: As directed ?  ? As per AVS  ? Increase activity slowly   Complete by: As directed ?  ? ?  ? ?Allergies as of 06/17/2021   ? ?   Reactions  ? Other Anaphylaxis  ? Reports he cardiac arrested after having general anesthesia - 2014?  ? ?  ? ?  ?Medication List  ?  ? ?STOP taking these medications   ? ?fluticasone 0.05 % cream ?Commonly known as: CUTIVATE ?  ?sildenafil 50 MG tablet ?Commonly known as: VIAGRA ?  ? ?  ? ?TAKE these medications   ? ?amLODipine 10 MG tablet ?Commonly known as: NORVASC ?Take 1 tablet (10 mg total) by mouth daily. ?What changed:  ?medication strength ?how much to take ?  ?aspirin 81 MG EC tablet ?Take 1 tablet (81 mg total) by mouth daily. Swallow whole. ?What changed: additional instructions ?  ?clopidogrel 75 MG tablet ?Commonly known as: Plavix ?Take 1 tablet (75 mg total) by mouth daily. ?  ?COQ10 PO ?Take 1 capsule by mouth every morning. ?  ?docusate sodium 250 MG capsule ?Commonly known as: COLACE ?Take 1 capsule (250 mg total) by mouth daily. ?  ?eszopiclone 3 MG Tabs ?Generic drug: Eszopiclone ?TAKE 1 TABLET BY MOUTH IMMEDIATELY BEFORE BEDTIME AS NEEDED ?What changed: Another medication with the same name was removed. Continue taking this medication, and follow the directions you see here. ?  ?Neuriva Caps ?Take 1 capsule by mouth every morning. ?  ?nitroGLYCERIN 0.4 MG SL tablet ?Commonly known as: NITROSTAT ?Place 1 tablet (0.4 mg total) under the tongue every 5 (five) minutes as needed for chest pain. ?  ?rosuvastatin 20 MG tablet ?Commonly known as: CRESTOR ?Take 1 tablet (20 mg total) by mouth daily. ?What changed:  ?medication strength ?how much to take ?Another medication with the same name was removed. Continue taking this medication, and follow the directions you see here. ?  ? ?  ? ?  ?  ? ? ?  ?Discharge Care Instructions  ?(From admission, onward)  ?  ? ? ?  ? ?  Start     Ordered  ? 06/17/21 0000  Discharge wound care:        ?Comments: As per AVS  ? 06/17/21 0846  ? ?  ?  ? ?  ? ? ? ? ? ?Nigel Mormon, MD ?Pager: [phone] ?Office: [phone] ? ? ?

## 2021-06-17 NOTE — Plan of Care (Signed)
°  Problem: Education: °Goal: Understanding of CV disease, CV risk reduction, and recovery process will improve °Outcome: Adequate for Discharge °Goal: Individualized Educational Video(s) °Outcome: Adequate for Discharge °  °

## 2021-06-17 NOTE — TOC Transition Note (Signed)
Transition of Care (TOC) - CM/SW Discharge Note ? ? ?Patient Details  ?Name: Keith Jones ?MRN: [id number] ?Date of Birth: 10-03-1950 ? ?Transition of Care (TOC) CM/SW Contact:  ?Pollie Friar, RN ?Phone Number: ?06/17/2021, 10:14 AM ? ? ?Clinical Narrative:    ?Patient discharging home with self care. No needs per TOC. ? ? ?Final next level of care: Home/Self Care ?Barriers to Discharge: No Barriers Identified ? ? ?Patient Goals and CMS Choice ?  ?  ?  ? ?Discharge Placement ?  ?           ?  ?  ?  ?  ? ?Discharge Plan and Services ?  ?  ?           ?  ?  ?  ?  ?  ?  ?  ?  ?  ?  ? ?Social Determinants of Health (SDOH) Interventions ?  ? ? ?Readmission Risk Interventions ?   ? View : No data to display.  ?  ?  ?  ? ? ? ? ? ?

## 2021-06-17 NOTE — Plan of Care (Signed)
?  Problem: Education: ?Goal: Understanding of CV disease, CV risk reduction, and recovery process will improve ?Outcome: Adequate for Discharge ?Goal: Individualized Educational Video(s) ?Outcome: Adequate for Discharge ?  ?Problem: Activity: ?Goal: Ability to return to baseline activity level will improve ?Outcome: Adequate for Discharge ?  ?Problem: Cardiovascular: ?Goal: Ability to achieve and maintain adequate cardiovascular perfusion will improve ?Outcome: Adequate for Discharge ?Goal: Vascular access site(s) Level 0-1 will be maintained ?Outcome: Adequate for Discharge ?  ?Problem: Health Behavior/Discharge Planning: ?Goal: Ability to safely manage health-related needs after discharge will improve ?Outcome: Adequate for Discharge ?  ?Problem: Education: ?Goal: Knowledge of General Education information will improve ?Description: Including pain rating scale, medication(s)/side effects and non-pharmacologic comfort measures ?Outcome: Adequate for Discharge ?  ?Problem: Health Behavior/Discharge Planning: ?Goal: Ability to manage health-related needs will improve ?Outcome: Adequate for Discharge ?  ?Problem: Clinical Measurements: ?Goal: Ability to maintain clinical measurements within normal limits will improve ?Outcome: Adequate for Discharge ?Goal: Will remain free from infection ?Outcome: Adequate for Discharge ?Goal: Diagnostic test results will improve ?Outcome: Adequate for Discharge ?Goal: Respiratory complications will improve ?Outcome: Adequate for Discharge ?Goal: Cardiovascular complication will be avoided ?Outcome: Adequate for Discharge ?  ?Problem: Nutrition: ?Goal: Adequate nutrition will be maintained ?Outcome: Adequate for Discharge ?  ?Problem: Coping: ?Goal: Level of anxiety will decrease ?Outcome: Adequate for Discharge ?  ?Problem: Elimination: ?Goal: Will not experience complications related to bowel motility ?Outcome: Adequate for Discharge ?Goal: Will not experience complications related  to urinary retention ?Outcome: Adequate for Discharge ?  ?Problem: Pain Managment: ?Goal: General experience of comfort will improve ?Outcome: Adequate for Discharge ?  ?Problem: Safety: ?Goal: Ability to remain free from injury will improve ?Outcome: Adequate for Discharge ?  ?Problem: Skin Integrity: ?Goal: Risk for impaired skin integrity will decrease ?Outcome: Adequate for Discharge ?  ?

## 2021-06-18 ENCOUNTER — Telehealth: Payer: Self-pay | Admitting: Internal Medicine

## 2021-06-18 ENCOUNTER — Other Ambulatory Visit (HOSPITAL_COMMUNITY): Payer: Self-pay

## 2021-06-18 NOTE — Telephone Encounter (Signed)
Follow-up after same day discharge: ?Implant date: 06/16/21 ?MD: Cristopher Peru, MD ?Device: Dual Chamber Medtronic PPM ?Location: left ? ? ?Wound check visit: 07/01/21 at 2:00 ?90 day MD follow-up: 10/06/21 at 2:30 ? ?Remote Transmission received:no ? ?Dressing removed: no ? ?Successful telephone encounter to patient who is currently in the grocery store. Planned phone call follow up today at 4pm.   ?

## 2021-06-18 NOTE — Telephone Encounter (Signed)
1. Has your device fired? no ? ?2. Is you device beeping? no ? ?3. Are you experiencing draining or swelling at device site? Yes, swelling and puffiness around the area. About 4-5 inches ? ?4. Are you calling to see if we received your device transmission? no ? ?5. Have you passed out? no ? ?Patient's wife says the patient has swelling around his device site. She also wants to know, since he has no transmissions scheduled, whether he needs to have the monitor set up or not bother yet. Phone: [phone] ? ?Please route to Device Clinic Pool  ?

## 2021-06-18 NOTE — Telephone Encounter (Signed)
Returned patient's call. Per patient wound healing well with steri strips intact. Arm restrictions reviewed. Confirmed monitor is plugged in however patient has not yet been admitted to [facility]. Will enroll and schedule 91 day follow up ASAP. Patient is advised of wound check and 91 day follow up appointment with [facility]. Provided device clinic contact if additional questions or concerns should arise.  ?

## 2021-06-19 ENCOUNTER — Telehealth (HOSPITAL_COMMUNITY): Payer: Self-pay

## 2021-06-19 NOTE — Telephone Encounter (Signed)
Pt is not interested in the cardiac rehab at this time. ?Closed referral. ?

## 2021-06-22 ENCOUNTER — Telehealth: Payer: Self-pay

## 2021-06-22 NOTE — Telephone Encounter (Signed)
The patient serial number for his monitor was wrong in our Care[facility] system. I fixed it. I told the patient that we will give it 24 hours and I will give him a call tomorrow.  ?

## 2021-06-23 ENCOUNTER — Other Ambulatory Visit (HOSPITAL_COMMUNITY): Payer: Self-pay

## 2021-06-23 NOTE — Telephone Encounter (Signed)
Transmission received.

## 2021-06-25 ENCOUNTER — Other Ambulatory Visit (HOSPITAL_COMMUNITY): Payer: Self-pay

## 2021-07-01 ENCOUNTER — Ambulatory Visit (INDEPENDENT_AMBULATORY_CARE_PROVIDER_SITE_OTHER): Payer: Medicare HMO

## 2021-07-01 DIAGNOSIS — I4430 Unspecified atrioventricular block: Secondary | ICD-10-CM | POA: Diagnosis not present

## 2021-07-01 DIAGNOSIS — R001 Bradycardia, unspecified: Secondary | ICD-10-CM

## 2021-07-01 NOTE — Patient Instructions (Signed)
? ?  After Your Pacemaker ? ? ?Monitor your pacemaker site for redness, swelling, and drainage. Call the device clinic at [phone] if you experience these symptoms or fever/chills. ? ?Your incision was closed with Steri-strips or staples:  You may shower and wash your incision with soap and water. Avoid lotions, ointments, or perfumes over your incision until it is well-healed. ? ?You may use a hot tub or a pool after your wound check appointment if the incision is completely closed. ? ?Do not lift, push or pull greater than 10 pounds with the affected arm until 6 weeks after your procedure. There are no other restrictions in arm movement after your wound check appointment.May 24 ? ?You may drive, unless driving has been restricted by your healthcare providers. ? ?Your Pacemaker is MRI compatible. ? ?Remote monitoring is used to monitor your pacemaker from home. This monitoring is scheduled every 91 days by our office. It allows [place] to keep an eye on the functioning of your device to ensure it is working properly. You will routinely see your Electrophysiologist annually (more often if necessary).  ?

## 2021-07-02 ENCOUNTER — Ambulatory Visit: Payer: Medicare HMO | Admitting: Cardiology

## 2021-07-02 ENCOUNTER — Other Ambulatory Visit: Payer: Self-pay

## 2021-07-02 ENCOUNTER — Other Ambulatory Visit: Payer: Medicare HMO

## 2021-07-02 ENCOUNTER — Encounter: Payer: Self-pay | Admitting: Cardiology

## 2021-07-02 VITALS — BP 135/82 | HR 84 | Temp 98.3°F | Resp 16 | Ht 72.0 in | Wt 183.4 lb

## 2021-07-02 DIAGNOSIS — I2510 Atherosclerotic heart disease of native coronary artery without angina pectoris: Secondary | ICD-10-CM | POA: Diagnosis not present

## 2021-07-02 DIAGNOSIS — I483 Typical atrial flutter: Secondary | ICD-10-CM

## 2021-07-02 DIAGNOSIS — I442 Atrioventricular block, complete: Secondary | ICD-10-CM | POA: Diagnosis not present

## 2021-07-02 LAB — CUP PACEART INCLINIC DEVICE CHECK
Battery Remaining Longevity: 144 mo
Battery Voltage: 3.21 V
Brady Statistic AP VP Percent: 6.35 %
Brady Statistic AP VS Percent: 0.04 %
Brady Statistic AS VP Percent: 91.13 %
Brady Statistic AS VS Percent: 2.49 %
Brady Statistic RA Percent Paced: 7.05 %
Brady Statistic RV Percent Paced: 97.48 %
Date Time Interrogation Session: 20230426141100
Implantable Lead Implant Date: 20230411
Implantable Lead Implant Date: 20230411
Implantable Lead Location: 753859
Implantable Lead Location: 753860
Implantable Lead Model: 3830
Implantable Lead Model: 5076
Implantable Pulse Generator Implant Date: 20230411
Lead Channel Impedance Value: 361 Ohm
Lead Channel Impedance Value: 418 Ohm
Lead Channel Impedance Value: 437 Ohm
Lead Channel Impedance Value: 646 Ohm
Lead Channel Pacing Threshold Amplitude: 0.5 V
Lead Channel Pacing Threshold Amplitude: 1 V
Lead Channel Pacing Threshold Pulse Width: 0.4 ms
Lead Channel Pacing Threshold Pulse Width: 0.4 ms
Lead Channel Sensing Intrinsic Amplitude: 31.625 mV
Lead Channel Sensing Intrinsic Amplitude: 4.875 mV
Lead Channel Setting Pacing Amplitude: 3.5 V
Lead Channel Setting Pacing Amplitude: 3.5 V
Lead Channel Setting Pacing Pulse Width: 0.4 ms
Lead Channel Setting Sensing Sensitivity: 0.9 mV

## 2021-07-02 NOTE — Progress Notes (Signed)
Wound check appointment. Steri-strips removed. Wound without redness or edema. Incision edges approximated, wound well healed. Normal device function. Thresholds, sensing, and impedances consistent with implant measurements. Device programmed at 3.5V/auto capture programmed on for extra safety margin until 3 month visit. Histogram distribution appropriate for patient and level of activity. No mode switches or high ventricular rates noted. Patient educated about wound care, arm mobility, lifting restrictions. Patient is enrolled in remote monitoring with next transmission 09/21/21. ROV in 3 months with implanting physician. ?

## 2021-07-02 NOTE — Progress Notes (Signed)
? ?Follow up visit ? ?Subjective:  ? ?Keith Jones, male    DOB: 1950-09-07, 71 y.o.   MRN: [id number] ? ? ?HPI ? ?Chief Complaint  ?Patient presents with  ? Cardiomyopathy  ? Follow-up  ? ?  ?71 y.o. caucasian male  with CAD s/p RCA PCI for severe stenosis, presentation with exertional dyspnea and lightheadedness, AV block-likely combination of degenerative and ischemic, now s/p PPM placement ?  ?Patient was hospitalized in 06/2021 with severe exertional dyspnea and lighteadedness. EKG showed 2:1 and intermittent complete AV block. Severe CAD was suspected given ST changes and recent CT cardiac scoring showing high calcium score particularly in RCA, He underwent successful PCI with two overlapping stents in large dominant mid-prox RCA with excellent results. While his AV conduction did improve 2 days after PCI with periods of 1:1 AV conduction, he still continued to have intermittent high grade AV block. Therefore, he underwent PPM placement on 06/16/2021. No hematoma post procedure. Patient was be discharged on DAPT with Aspirin, plavix, increased dose of Crestor 20 mg, increase amlodipine 10 mg for better BP control.  ? ?He is doing very well since hospital discharge, with complete resolution of dyspnea and lightheadedness symptoms. Blood pressure is well controlled.  ? ? ? ?Current Outpatient Medications:  ?  amLODipine (NORVASC) 10 MG tablet, Take 1 tablet (10 mg total) by mouth daily., Disp: 30 tablet, Rfl: 2 ?  aspirin 81 MG EC tablet, Take 1 tablet (81 mg total) by mouth daily. Swallow whole., Disp: 30 tablet, Rfl: 3 ?  clopidogrel (PLAVIX) 75 MG tablet, Take 1 tablet (75 mg total) by mouth daily., Disp: 30 tablet, Rfl: 2 ?  Coenzyme Q10 (COQ10 PO), Take 1 capsule by mouth every morning., Disp: , Rfl:  ?  docusate sodium (COLACE) 250 MG capsule, Take 1 capsule (250 mg total) by mouth daily. (Patient not taking: Reported on 06/12/2021), Disp: 30 capsule, Rfl: 0 ?  Eszopiclone 3 MG TABS, TAKE 1 TABLET BY MOUTH  IMMEDIATELY BEFORE BEDTIME AS NEEDED (Patient not taking: Reported on 06/12/2021), Disp: 30 tablet, Rfl: 1 ?  Misc Natural Products (NEURIVA) CAPS, Take 1 capsule by mouth every morning., Disp: , Rfl:  ?  nitroGLYCERIN (NITROSTAT) 0.4 MG SL tablet, Place 1 tablet (0.4 mg total) under the tongue every 5 (five) minutes as needed for chest pain., Disp: 25 tablet, Rfl: 2 ?  rosuvastatin (CRESTOR) 20 MG tablet, Take 1 tablet (20 mg total) by mouth daily., Disp: 30 tablet, Rfl: 2  ? ?Cardiovascular & other pertient studies: ? ?Reviewed external labs and tests, independently interpreted ? ?EKG 07/02/2021: ?SInus rhythm ?Ventricular pacing ? ?06/16/2021 (Dr. Lovena Le, [place]): ? 1. Successful implantation of a medtronic dual-chamber pacemaker for symptomatic bradycardia due to 2:1 AV block ? 2. No early apparent complications.  ?   ?Coronary intervention 06/12/2021: ?LM: Normal ?LAD: Normal ?Lcx: Mild mid Lcx disease ?RCA: Large, dominant vessel ?        Mid 80% stenosis ?  ?This was felt to be possible etiology for symptomatic ischemic AV conduction disease ?        ?       Successful percutaneous coronary intervention mRCA ?       PTCA and overlapping stent placement 4.0X18 & 4.0X15 mm Onyx Frontier drug-eluting stents ?       Second proximal stent necessitated by proximal edge dissection propagating into prox RCA ?       Post dilatation with 4.0X12 mm [place] balloon up to 22  atm ?  ?Monitor next 24-48 hours to assess rhythm and symptoms to then decide whether patient would need permanent pacemaker. Appreciate EP input.  ? ?Recent labs: ?06/17/2021: ?Glucose 97, BUN/Cr 15/1.04. EGFR >60. Na/K 138/3.4.  ?H/H 14/41. MCV 87. Platelets 191 ?HbA1C 5.3% ?Chol 135, TG 57, HDL 53, LDL 71 ?TSH 2.2 normal ? ? ?Review of Systems  ?Cardiovascular:  Negative for chest pain, dyspnea on exertion, leg swelling, palpitations and syncope.  ? ?   ? ? ?Vitals:  ? 07/02/21 1108  ?BP: 135/82  ?Pulse: 84  ?Resp: 16  ?Temp: 98.3 ?F (36.8 ?C)  ?SpO2: 98%   ? ? ?Body mass index is 24.87 kg/m?. ?Filed Weights  ? 07/02/21 1108  ?Weight: 183 lb 6.4 oz (83.2 kg)  ? ? ? ?Objective:  ? Physical Exam ?Vitals and nursing note reviewed.  ?Constitutional:   ?   General: He is not in acute distress. ?Neck:  ?   Vascular: No JVD.  ?Cardiovascular:  ?   Rate and Rhythm: Normal rate and regular rhythm.  ?   Heart sounds: Normal heart sounds. No murmur heard. ?Pulmonary:  ?   Effort: Pulmonary effort is normal.  ?   Breath sounds: Normal breath sounds. No wheezing or rales.  ?Chest:  ?   Comments: Well healed pacemaker scar ?Musculoskeletal:  ?   Right lower leg: No edema.  ?   Left lower leg: No edema.  ? ? ? ? ? ?   ? ? ?Visit diagnoses: ?  ICD-10-CM   ?1. AV block, complete (HCC)  I44.2 EKG 12-Lead  ?  ?2. Coronary artery disease involving native coronary artery of native heart without angina pectoris  I25.10   ?  ?  ? ?Current Outpatient Medications:  ?  Coenzyme Q10 (COQ10 PO), Take 1 capsule by mouth every morning., Disp: , Rfl:  ?  Eszopiclone 3 MG TABS, TAKE 1 TABLET BY MOUTH IMMEDIATELY BEFORE BEDTIME AS NEEDED, Disp: 30 tablet, Rfl: 1 ?  Misc Natural Products (NEURIVA) CAPS, Take 1 capsule by mouth every morning., Disp: , Rfl:  ?  nitroGLYCERIN (NITROSTAT) 0.4 MG SL tablet, Place 1 tablet (0.4 mg total) under the tongue every 5 (five) minutes as needed for chest pain., Disp: 25 tablet, Rfl: 2 ?  amLODipine (NORVASC) 10 MG tablet, Take 1 tablet (10 mg total) by mouth daily., Disp: 30 tablet, Rfl: 2 ?  aspirin 81 MG EC tablet, Take 1 tablet (81 mg total) by mouth daily. Swallow whole., Disp: 30 tablet, Rfl: 3 ?  clopidogrel (PLAVIX) 75 MG tablet, Take 1 tablet (75 mg total) by mouth daily., Disp: 90 tablet, Rfl: 1 ?  rosuvastatin (CRESTOR) 20 MG tablet, Take 1 tablet (20 mg total) by mouth daily., Disp: 90 tablet, Rfl: 1 ? ? ?Assessment & Recommendations:  ? ? ?71 y.o. caucasian male  with CAD s/p RCA PCI for severe stenosis, presentation with exertional dyspnea and  lightheadedness, AV block-likely combination of degenerative and ischemic, now s/p PPM placement ? ?CAD: ?S/p RCA PCI for unstable angina, suspected ischemic heart block (06/2021) ?Recommend DAPT with Aspirin and plavix till 12/2021. ?Continue Crestor. ? ?Complete AV block: ?Now /sp PPM ? ?Hypertension: ?Well controlled ? ?F/u in 6 months ? ? ? ?Nigel Mormon, MD ?Pager: [phone] ?Office: [phone] ?

## 2021-07-03 ENCOUNTER — Other Ambulatory Visit (HOSPITAL_COMMUNITY): Payer: Self-pay

## 2021-07-03 ENCOUNTER — Encounter: Payer: Self-pay | Admitting: Cardiology

## 2021-07-03 DIAGNOSIS — I2510 Atherosclerotic heart disease of native coronary artery without angina pectoris: Secondary | ICD-10-CM | POA: Insufficient documentation

## 2021-07-03 DIAGNOSIS — I25118 Atherosclerotic heart disease of native coronary artery with other forms of angina pectoris: Secondary | ICD-10-CM | POA: Insufficient documentation

## 2021-07-03 MED ORDER — AMLODIPINE BESYLATE 10 MG PO TABS
10.0000 mg | ORAL_TABLET | Freq: Every day | ORAL | 2 refills | Status: DC
  Filled 2021-07-03: qty 30, 30d supply, fill #0
  Filled 2021-08-19: qty 30, 30d supply, fill #1
  Filled 2021-09-16: qty 30, 30d supply, fill #2

## 2021-07-03 MED ORDER — ROSUVASTATIN CALCIUM 20 MG PO TABS
20.0000 mg | ORAL_TABLET | Freq: Every day | ORAL | 1 refills | Status: DC
  Filled 2021-07-03: qty 90, 90d supply, fill #0
  Filled 2021-10-07: qty 90, 90d supply, fill #1

## 2021-07-03 MED ORDER — ASPIRIN 81 MG PO TBEC
81.0000 mg | DELAYED_RELEASE_TABLET | Freq: Every day | ORAL | 3 refills | Status: DC
  Filled 2021-07-03 – 2022-04-26 (×5): qty 30, 30d supply, fill #0

## 2021-07-03 MED ORDER — CLOPIDOGREL BISULFATE 75 MG PO TABS
75.0000 mg | ORAL_TABLET | Freq: Every day | ORAL | 1 refills | Status: DC
  Filled 2021-07-03: qty 90, 90d supply, fill #0
  Filled 2021-10-07: qty 90, 90d supply, fill #1

## 2021-07-13 ENCOUNTER — Other Ambulatory Visit (HOSPITAL_COMMUNITY): Payer: Self-pay

## 2021-07-13 ENCOUNTER — Telehealth (HOSPITAL_COMMUNITY): Payer: Self-pay

## 2021-07-13 NOTE — Telephone Encounter (Signed)
Pharmacy Transitions of Care Follow-up Telephone Call ? ?Date of discharge: 06/17/21  ?Discharge Diagnosis: PCI and AV block ? ?How have you been since you were released from the hospital? Patient doing well.   ? ?Medication changes made at discharge: ?     START taking: ?nitroGLYCERIN (NITROSTAT)  ? CHANGE how you take: ?eszopiclone (Eszopiclone)  ? STOP taking: ?amLODipine 5 MG tablet (NORVASC)  ?aspirin EC 81 MG tablet  ?fluticasone 0.05 % cream (CUTIVATE)  ?rosuvastatin 5 MG tablet (CRESTOR)  ?sildenafil 50 MG tablet (VIAGRA)  ? ?Medication changes verified by the patient? Yes ?  ?Medication Accessibility: ? ?Home Pharmacy: [facility]  ? ?Was the patient provided with refills on discharged medications? Yes  ? ?Have all prescriptions been transferred from [facility] to home pharmacy? Yes  ? ?Is the patient able to afford medications? Yes ?Notable copays: $9/90 days supply ? ?Medication Review: ? ?CLOPIDOGREL (PLAVIX) ?Clopidogrel 75 mg once daily.   ?- Reviewed potential DDIs with patient  ?- Advised patient of medications to avoid (NSAIDs, ASA)  ?- Educated that Tylenol (acetaminophen) will be the preferred analgesic to prevent risk of bleeding  ?- Emphasized importance of monitoring for signs and symptoms of bleeding (abnormal bruising, prolonged bleeding, nose bleeds, bleeding from gums, discolored urine, black tarry stools)  ?- Advised patient to alert all providers of anticoagulation therapy prior to starting a new medication or having a procedure  ? ?Follow-up Appointments: ? ?PCP Hospital f/u appt confirmed?  Scheduled to see Jud, [place] on 10/06/21 @ 2:20 PM.  ? ?If their condition worsens, is the pt aware to call PCP or go to the Emergency Dept.? Yes ? ?Final Patient Assessment: ?-Pt is doing well.  ?-Pt verbalized understanding of clopidogrel and associated side effects. ?-Pt has post discharge appointment and refill sent to [facility] ([place] ? ? ?

## 2021-08-19 ENCOUNTER — Other Ambulatory Visit (HOSPITAL_COMMUNITY): Payer: Self-pay

## 2021-09-02 DIAGNOSIS — H40053 Ocular hypertension, bilateral: Secondary | ICD-10-CM | POA: Diagnosis not present

## 2021-09-02 DIAGNOSIS — H02834 Dermatochalasis of left upper eyelid: Secondary | ICD-10-CM | POA: Diagnosis not present

## 2021-09-02 DIAGNOSIS — H2513 Age-related nuclear cataract, bilateral: Secondary | ICD-10-CM | POA: Diagnosis not present

## 2021-09-02 DIAGNOSIS — H02831 Dermatochalasis of right upper eyelid: Secondary | ICD-10-CM | POA: Diagnosis not present

## 2021-09-16 ENCOUNTER — Other Ambulatory Visit (HOSPITAL_COMMUNITY): Payer: Self-pay

## 2021-09-21 ENCOUNTER — Ambulatory Visit (INDEPENDENT_AMBULATORY_CARE_PROVIDER_SITE_OTHER): Payer: Medicare HMO

## 2021-09-21 DIAGNOSIS — I442 Atrioventricular block, complete: Secondary | ICD-10-CM

## 2021-09-23 LAB — CUP PACEART REMOTE DEVICE CHECK
Battery Remaining Longevity: 139 mo
Battery Voltage: 3.17 V
Brady Statistic AP VP Percent: 10.52 %
Brady Statistic AP VS Percent: 0.02 %
Brady Statistic AS VP Percent: 87.87 %
Brady Statistic AS VS Percent: 1.6 %
Brady Statistic RA Percent Paced: 11.12 %
Brady Statistic RV Percent Paced: 98.39 %
Date Time Interrogation Session: 20230716230514
Implantable Lead Implant Date: 20230411
Implantable Lead Implant Date: 20230411
Implantable Lead Location: 753859
Implantable Lead Location: 753860
Implantable Lead Model: 3830
Implantable Lead Model: 5076
Implantable Pulse Generator Implant Date: 20230411
Lead Channel Impedance Value: 361 Ohm
Lead Channel Impedance Value: 437 Ohm
Lead Channel Impedance Value: 570 Ohm
Lead Channel Impedance Value: 589 Ohm
Lead Channel Pacing Threshold Amplitude: 0.5 V
Lead Channel Pacing Threshold Amplitude: 0.75 V
Lead Channel Pacing Threshold Pulse Width: 0.4 ms
Lead Channel Pacing Threshold Pulse Width: 0.4 ms
Lead Channel Sensing Intrinsic Amplitude: 3.75 mV
Lead Channel Sensing Intrinsic Amplitude: 3.75 mV
Lead Channel Sensing Intrinsic Amplitude: 31.625 mV
Lead Channel Sensing Intrinsic Amplitude: 31.625 mV
Lead Channel Setting Pacing Amplitude: 3.5 V
Lead Channel Setting Pacing Amplitude: 3.5 V
Lead Channel Setting Pacing Pulse Width: 0.4 ms
Lead Channel Setting Sensing Sensitivity: 0.9 mV

## 2021-10-06 ENCOUNTER — Encounter: Payer: Self-pay | Admitting: Internal Medicine

## 2021-10-06 ENCOUNTER — Ambulatory Visit (INDEPENDENT_AMBULATORY_CARE_PROVIDER_SITE_OTHER): Payer: Medicare HMO | Admitting: Internal Medicine

## 2021-10-06 VITALS — BP 103/61 | HR 67 | Ht 72.0 in | Wt 179.0 lb

## 2021-10-06 DIAGNOSIS — I4430 Unspecified atrioventricular block: Secondary | ICD-10-CM | POA: Diagnosis not present

## 2021-10-06 DIAGNOSIS — I2510 Atherosclerotic heart disease of native coronary artery without angina pectoris: Secondary | ICD-10-CM

## 2021-10-06 DIAGNOSIS — I4510 Unspecified right bundle-branch block: Secondary | ICD-10-CM

## 2021-10-06 DIAGNOSIS — Z950 Presence of cardiac pacemaker: Secondary | ICD-10-CM | POA: Insufficient documentation

## 2021-10-06 NOTE — Patient Instructions (Signed)
Medication Instructions:  Your physician recommends that you continue on your current medications as directed. Please refer to the Current Medication list given to you today.  *If you need a refill on your cardiac medications before your next appointment, please call your pharmacy*  Lab Work: None ordered.  If you have labs (blood work) drawn today and your tests are completely normal, you will receive your results only by: [place] (if you have MyChart) OR A paper copy in the mail If you have any lab test that is abnormal or we need to change your treatment, we will call you to review the results.  Testing/Procedures: None ordered.  Follow-Up: At [facility], you and your health needs are our priority.  As part of our continuing mission to provide you with exceptional heart care, we have created designated Provider Care Teams.  These Care Teams include your primary Cardiologist (physician) and Advanced Practice Providers (APPs -  Physician Assistants and Nurse Practitioners) who all work together to provide you with the care you need, when you need it.  We recommend signing up for the patient portal called "MyChart".  Sign up information is provided on this After Visit Summary.  MyChart is used to connect with patients for Virtual Visits (Telemedicine).  Patients are able to view lab/test results, encounter notes, upcoming appointments, etc.  Non-urgent messages can be sent to your provider as well.   To learn more about what you can do with MyChart, go to [email or web address].    Your next appointment:   1 year(s)  The format for your next appointment:   In Person  Provider:   Cristopher Peru, MD{or one of the following Advanced Practice Providers on your designated Care Team:   Tommye Standard, [place] Legrand Como "Jonni Sanger" Chalmers Cater, [place]  Remote monitoring is used to monitor your Pacemaker from home. This monitoring reduces the number of office visits required to check your device to  one time per year. It allows [place] to keep an eye on the functioning of your device to ensure it is working properly. You are scheduled for a device check from home on 12/21/2021. You may send your transmission at any time that day. If you have a wireless device, the transmission will be sent automatically. After your physician reviews your transmission, you will receive a postcard with your next transmission date.  Important Information About Sugar

## 2021-10-06 NOTE — Progress Notes (Signed)
HPI Keith Jones returns today for followup. He is a pleasant 71 yo man with a h/o HTN, dyslipidemia, who developed 2:1 AV block. The patient is doing well after PPM insertion. He notes some mild dyspnea but also notes that he was able to walk for over 40 minutes today at a brisk pace. He denies anginal symptoms No edema or syncope.  Allergies  Allergen Reactions   Other Anaphylaxis    Reports he cardiac arrested after having general anesthesia - 2014?     Current Outpatient Medications  Medication Sig Dispense Refill   amLODipine (NORVASC) 10 MG tablet Take 1 tablet (10 mg total) by mouth daily. 30 tablet 2   aspirin 81 MG EC tablet Take 1 tablet (81 mg total) by mouth daily. Swallow whole. 30 tablet 3   clopidogrel (PLAVIX) 75 MG tablet Take 1 tablet (75 mg total) by mouth daily. 90 tablet 1   Coenzyme Q10 (COQ10 PO) Take 1 capsule by mouth every morning.     Eszopiclone 3 MG TABS TAKE 1 TABLET BY MOUTH IMMEDIATELY BEFORE BEDTIME AS NEEDED 30 tablet 1   Misc Natural Products (NEURIVA) CAPS Take 1 capsule by mouth every morning.     nitroGLYCERIN (NITROSTAT) 0.4 MG SL tablet Place 1 tablet (0.4 mg total) under the tongue every 5 (five) minutes as needed for chest pain. 25 tablet 2   rosuvastatin (CRESTOR) 20 MG tablet Take 1 tablet (20 mg total) by mouth daily. 90 tablet 1   No current facility-administered medications for this visit.     Past Medical History:  Diagnosis Date   BPH (benign prostatic hypertrophy)    Erectile dysfunction    H/O seasonal allergies 11-16-12   allergy shots past, now no problems   Insomnia    RBBB (right bundle branch block with left anterior fascicular block)     ROS:   All systems reviewed and negative except as noted in the HPI.   Past Surgical History:  Procedure Laterality Date   CORONARY STENT INTERVENTION N/A 06/12/2021   Procedure: CORONARY STENT INTERVENTION;  Surgeon: Nigel Mormon, MD;  Location: [place] [facility];   Service: Cardiovascular;  Laterality: N/A;   CYSTOSCOPY N/A 11/24/2012   Procedure: CYSTOSCOPY;  Surgeon: Ailene Rud, MD;  Location: [facility];  Service: Urology;  Laterality: N/A;   LEFT HEART CATH AND CORONARY ANGIOGRAPHY N/A 06/12/2021   Procedure: LEFT HEART CATH AND CORONARY ANGIOGRAPHY;  Surgeon: Nigel Mormon, MD;  Location: [place] [facility];  Service: Cardiovascular;  Laterality: N/A;   PACEMAKER IMPLANT N/A 06/16/2021   Procedure: PACEMAKER IMPLANT;  Surgeon: Evans Lance, MD;  Location: [place] [facility];  Service: Cardiovascular;  Laterality: N/A;   TONSILLECTOMY     TRANSURETHRAL RESECTION OF PROSTATE N/A 11/24/2012   Procedure: TRANSURETHRAL RESECTION OF THE PROSTATE WITH GYRUS INSTRUMENTS;  Surgeon: Ailene Rud, MD;  Location: [facility];  Service: Urology;  Laterality: N/A;     Family History  Problem Relation Age of Onset   Brain cancer Mother 19   Prostate cancer Father 52   Other Other        noncontributory     Social History   Socioeconomic History   Marital status: Married    Spouse name: Not on file   Number of children: 0   Years of education: Not on file   Highest education level: Not on file  Occupational History   Occupation: sedentary  Employer: RETIRED  Tobacco Use   Smoking status: Never   Smokeless tobacco: Never  Vaping Use   Vaping Use: Never used  Substance and Sexual Activity   Alcohol use: Yes    Comment: rare occ.- wine/beer   Drug use: No   Sexual activity: Not on file  Other Topics Concern   Not on file  Social History Narrative   Not on file   Social Determinants of Health   Financial Resource Strain: Not on file  Food Insecurity: Not on file  Transportation Needs: Not on file  Physical Activity: Not on file  Stress: Not on file  Social Connections: Not on file  Intimate Partner Violence: Not on file     BP 103/61   Pulse 67   Ht 6' (1.829 m)   Wt 179 lb (81.2 kg)   SpO2 96%   BMI 24.28 kg/m    Physical Exam:  Well appearing NAD HEENT: Unremarkable Neck:  No JVD, no thyromegally Lymphatics:  No adenopathy Back:  No CVA tenderness Lungs:  Clear with no wheezes HEART:  Regular rate rhythm, no murmurs, no rubs, no clicks Abd:  soft, positive bowel sounds, no organomegally, no rebound, no guarding Ext:  2 plus pulses, no edema, no cyanosis, no clubbing Skin:  No rashes no nodules Neuro:  CN II through XII intact, motor grossly intact  EKG - nsr with venticular pacing  DEVICE  Normal device function.  See PaceArt for details.   Assess/Plan:  Heart block - today he has no escape at 40/min. He is asymptomatic s/p PPM insertion. PPM -his medtronic DDD PM is working normally and leads look good. Dyslipidemia - he will continue statin therapy with crestor. HTN - he will continue amlodipine.   Carleene Overlie Safran,MD

## 2021-10-07 ENCOUNTER — Other Ambulatory Visit (HOSPITAL_COMMUNITY): Payer: Self-pay

## 2021-10-14 ENCOUNTER — Other Ambulatory Visit (HOSPITAL_COMMUNITY): Payer: Self-pay

## 2021-10-14 ENCOUNTER — Other Ambulatory Visit: Payer: Self-pay | Admitting: Cardiology

## 2021-10-14 DIAGNOSIS — I2510 Atherosclerotic heart disease of native coronary artery without angina pectoris: Secondary | ICD-10-CM

## 2021-10-14 MED ORDER — AMLODIPINE BESYLATE 10 MG PO TABS
10.0000 mg | ORAL_TABLET | Freq: Every day | ORAL | 2 refills | Status: DC
  Filled 2021-10-14: qty 30, 30d supply, fill #0
  Filled 2021-11-17: qty 30, 30d supply, fill #1
  Filled 2021-12-30: qty 30, 30d supply, fill #2

## 2021-10-15 ENCOUNTER — Other Ambulatory Visit (HOSPITAL_COMMUNITY): Payer: Self-pay

## 2021-10-15 DIAGNOSIS — L814 Other melanin hyperpigmentation: Secondary | ICD-10-CM | POA: Diagnosis not present

## 2021-10-15 DIAGNOSIS — L821 Other seborrheic keratosis: Secondary | ICD-10-CM | POA: Diagnosis not present

## 2021-10-15 DIAGNOSIS — Z85828 Personal history of other malignant neoplasm of skin: Secondary | ICD-10-CM | POA: Diagnosis not present

## 2021-10-15 DIAGNOSIS — Z08 Encounter for follow-up examination after completed treatment for malignant neoplasm: Secondary | ICD-10-CM | POA: Diagnosis not present

## 2021-10-15 DIAGNOSIS — L578 Other skin changes due to chronic exposure to nonionizing radiation: Secondary | ICD-10-CM | POA: Diagnosis not present

## 2021-10-15 DIAGNOSIS — L570 Actinic keratosis: Secondary | ICD-10-CM | POA: Diagnosis not present

## 2021-10-15 DIAGNOSIS — D225 Melanocytic nevi of trunk: Secondary | ICD-10-CM | POA: Diagnosis not present

## 2021-10-15 DIAGNOSIS — L218 Other seborrheic dermatitis: Secondary | ICD-10-CM | POA: Diagnosis not present

## 2021-10-20 NOTE — Progress Notes (Signed)
Remote pacemaker transmission.   

## 2021-10-29 DIAGNOSIS — I441 Atrioventricular block, second degree: Secondary | ICD-10-CM | POA: Diagnosis not present

## 2021-10-29 DIAGNOSIS — I2510 Atherosclerotic heart disease of native coronary artery without angina pectoris: Secondary | ICD-10-CM | POA: Diagnosis not present

## 2021-10-29 DIAGNOSIS — R0602 Shortness of breath: Secondary | ICD-10-CM | POA: Diagnosis not present

## 2021-11-17 ENCOUNTER — Other Ambulatory Visit (HOSPITAL_COMMUNITY): Payer: Self-pay

## 2021-11-19 ENCOUNTER — Other Ambulatory Visit (HOSPITAL_COMMUNITY): Payer: Self-pay

## 2021-12-01 ENCOUNTER — Telehealth: Payer: Self-pay

## 2021-12-01 NOTE — Telephone Encounter (Signed)
CareAlert triggered for VT-mon episode (logged as 2 episodes, however only 1) occurring on 12/01/21 @ 0811 duration 25 beats. Routing for further review.  Attempted to contact patient for s/s. No answer, LMTCB.

## 2021-12-01 NOTE — Telephone Encounter (Signed)
Patient reports he was asleep during this time and compliant with all medications. Advised we will return call if any changes are warranted. Routing to Dr. Lovena Le per protocol.

## 2021-12-01 NOTE — Telephone Encounter (Signed)
Pt is returning call. Transferred to the device clinic.

## 2021-12-15 DIAGNOSIS — I2510 Atherosclerotic heart disease of native coronary artery without angina pectoris: Secondary | ICD-10-CM | POA: Diagnosis not present

## 2021-12-15 DIAGNOSIS — Z79899 Other long term (current) drug therapy: Secondary | ICD-10-CM | POA: Diagnosis not present

## 2021-12-15 DIAGNOSIS — Z0001 Encounter for general adult medical examination with abnormal findings: Secondary | ICD-10-CM | POA: Diagnosis not present

## 2021-12-15 DIAGNOSIS — I10 Essential (primary) hypertension: Secondary | ICD-10-CM | POA: Diagnosis not present

## 2021-12-15 DIAGNOSIS — N400 Enlarged prostate without lower urinary tract symptoms: Secondary | ICD-10-CM | POA: Diagnosis not present

## 2021-12-15 DIAGNOSIS — G4700 Insomnia, unspecified: Secondary | ICD-10-CM | POA: Diagnosis not present

## 2021-12-15 DIAGNOSIS — I441 Atrioventricular block, second degree: Secondary | ICD-10-CM | POA: Diagnosis not present

## 2021-12-15 DIAGNOSIS — E7800 Pure hypercholesterolemia, unspecified: Secondary | ICD-10-CM | POA: Diagnosis not present

## 2021-12-21 ENCOUNTER — Ambulatory Visit (INDEPENDENT_AMBULATORY_CARE_PROVIDER_SITE_OTHER): Payer: Medicare HMO

## 2021-12-21 DIAGNOSIS — I4430 Unspecified atrioventricular block: Secondary | ICD-10-CM | POA: Diagnosis not present

## 2021-12-22 LAB — CUP PACEART REMOTE DEVICE CHECK
Battery Remaining Longevity: 139 mo
Battery Voltage: 3.13 V
Brady Statistic AP VP Percent: 11.9 %
Brady Statistic AP VS Percent: 0.05 %
Brady Statistic AS VP Percent: 77.69 %
Brady Statistic AS VS Percent: 10.36 %
Brady Statistic RA Percent Paced: 14.65 %
Brady Statistic RV Percent Paced: 89.59 %
Date Time Interrogation Session: 20231016002442
Implantable Lead Implant Date: 20230411
Implantable Lead Implant Date: 20230411
Implantable Lead Location: 753859
Implantable Lead Location: 753860
Implantable Lead Model: 3830
Implantable Lead Model: 5076
Implantable Pulse Generator Implant Date: 20230411
Lead Channel Impedance Value: 323 Ohm
Lead Channel Impedance Value: 418 Ohm
Lead Channel Impedance Value: 437 Ohm
Lead Channel Impedance Value: 589 Ohm
Lead Channel Pacing Threshold Amplitude: 0.5 V
Lead Channel Pacing Threshold Amplitude: 0.625 V
Lead Channel Pacing Threshold Pulse Width: 0.4 ms
Lead Channel Pacing Threshold Pulse Width: 0.4 ms
Lead Channel Sensing Intrinsic Amplitude: 3.5 mV
Lead Channel Sensing Intrinsic Amplitude: 3.5 mV
Lead Channel Sensing Intrinsic Amplitude: 31.625 mV
Lead Channel Sensing Intrinsic Amplitude: 31.625 mV
Lead Channel Setting Pacing Amplitude: 1.5 V
Lead Channel Setting Pacing Amplitude: 2.5 V
Lead Channel Setting Pacing Pulse Width: 0.4 ms
Lead Channel Setting Sensing Sensitivity: 1.2 mV

## 2021-12-31 ENCOUNTER — Other Ambulatory Visit (HOSPITAL_COMMUNITY): Payer: Self-pay

## 2022-01-04 ENCOUNTER — Encounter: Payer: Self-pay | Admitting: Cardiology

## 2022-01-04 ENCOUNTER — Other Ambulatory Visit (HOSPITAL_COMMUNITY): Payer: Self-pay

## 2022-01-04 ENCOUNTER — Ambulatory Visit: Payer: Medicare HMO | Admitting: Cardiology

## 2022-01-04 ENCOUNTER — Other Ambulatory Visit: Payer: Self-pay

## 2022-01-04 VITALS — BP 128/71 | HR 84 | Resp 16 | Ht 72.0 in | Wt 175.0 lb

## 2022-01-04 DIAGNOSIS — I4430 Unspecified atrioventricular block: Secondary | ICD-10-CM | POA: Diagnosis not present

## 2022-01-04 DIAGNOSIS — E782 Mixed hyperlipidemia: Secondary | ICD-10-CM | POA: Diagnosis not present

## 2022-01-04 DIAGNOSIS — I2510 Atherosclerotic heart disease of native coronary artery without angina pectoris: Secondary | ICD-10-CM | POA: Diagnosis not present

## 2022-01-04 MED ORDER — ROSUVASTATIN CALCIUM 40 MG PO TABS
40.0000 mg | ORAL_TABLET | Freq: Every day | ORAL | 3 refills | Status: DC
  Filled 2022-01-04: qty 90, 90d supply, fill #0
  Filled 2022-04-02: qty 90, 90d supply, fill #1
  Filled 2022-07-26: qty 90, 90d supply, fill #2
  Filled 2022-11-08: qty 90, 90d supply, fill #3

## 2022-01-04 NOTE — Progress Notes (Signed)
Follow up visit  Subjective:   Keith Jones, male    DOB: 11-29-1950, 71 y.o.   MRN: [id number]   HPI  Chief Complaint  Patient presents with   Coronary Artery Disease   Follow-up    17 month     71 y.o. caucasian male  with CAD s/p RCA PCI for severe stenosis, presentation with exertional dyspnea and lightheadedness, AV block-likely combination of degenerative and ischemic, now s/p PPM placement   Patient is doing well, denies chest pain, shortness of breath, palpitations, leg edema, orthopnea, PND, TIA/syncope. He wants to start light weight training. Reviewed recent test results with the patient, details below.      Current Outpatient Medications:    amLODipine (NORVASC) 10 MG tablet, Take 1 tablet (10 mg total) by mouth daily., Disp: 30 tablet, Rfl: 2   aspirin EC 81 MG tablet, Take 1 tablet (81 mg total) by mouth daily. Swallow whole., Disp: 30 tablet, Rfl: 3   clopidogrel (PLAVIX) 75 MG tablet, Take 1 tablet (75 mg total) by mouth daily., Disp: 90 tablet, Rfl: 1   Coenzyme Q10 (COQ10 PO), Take 1 capsule by mouth every morning., Disp: , Rfl:    Eszopiclone 3 MG TABS, TAKE 1 TABLET BY MOUTH IMMEDIATELY BEFORE BEDTIME AS NEEDED, Disp: 30 tablet, Rfl: 1   MELATONIN PO, Take by mouth., Disp: , Rfl:    Misc Natural Products (NEURIVA) CAPS, Take 1 capsule by mouth every morning., Disp: , Rfl:    nitroGLYCERIN (NITROSTAT) 0.4 MG SL tablet, Place 1 tablet (0.4 mg total) under the tongue every 5 (five) minutes as needed for chest pain., Disp: 25 tablet, Rfl: 2   rosuvastatin (CRESTOR) 20 MG tablet, Take 1 tablet (20 mg total) by mouth daily., Disp: 90 tablet, Rfl: 1   Cardiovascular & other pertient studies:  Reviewed external labs and tests, independently interpreted  EKG 01/04/2022: Sinus rhythm 73 bpm Ventricular pacing  06/16/2021 (Dr. Lovena Le, [place]):  1. Successful implantation of a medtronic dual-chamber pacemaker for symptomatic bradycardia due to 2:1 AV block  2.  No early apparent complications.     Coronary intervention 06/12/2021: LM: Normal LAD: Normal Lcx: Mild mid Lcx disease RCA: Large, dominant vessel         Mid 80% stenosis   This was felt to be possible etiology for symptomatic ischemic AV conduction disease                Successful percutaneous coronary intervention mRCA        PTCA and overlapping stent placement 4.0X18 & 4.0X15 mm Onyx Frontier drug-eluting stents        Second proximal stent necessitated by proximal edge dissection propagating into prox RCA        Post dilatation with 4.0X12 mm [place] balloon up to 22 atm   Monitor next 24-48 hours to assess rhythm and symptoms to then decide whether patient would need permanent pacemaker. Appreciate EP input.   Recent labs: 12/15/2021: Glucose 88, BUN/Cr 21/1.2. EGFR 65. K 3.9. HbA1C 5.3% Chol 152, TG 94, HDL 54, LDL 81 TSH 2.3  06/17/2021: Glucose 97, BUN/Cr 15/1.04. EGFR >60. Na/K 138/3.4.  H/H 14/41. MCV 87. Platelets 191 HbA1C 5.3% Chol 135, TG 57, HDL 53, LDL 71 TSH 2.2 normal   Review of Systems  Cardiovascular:  Negative for chest pain, dyspnea on exertion, leg swelling, palpitations and syncope.         Vitals:   01/04/22 1028 01/04/22 1029  BP: (!) 158/83  128/71  Pulse: 89 84  Resp: 16   SpO2: 98%     Body mass index is 23.73 kg/m. Filed Weights   01/04/22 1028  Weight: 175 lb (79.4 kg)     Objective:   Physical Exam Vitals and nursing note reviewed.  Constitutional:      General: He is not in acute distress. Neck:     Vascular: No JVD.  Cardiovascular:     Rate and Rhythm: Normal rate and regular rhythm.     Heart sounds: Normal heart sounds. No murmur heard. Pulmonary:     Effort: Pulmonary effort is normal.     Breath sounds: Normal breath sounds. No wheezing or rales.  Chest:     Comments: Well healed pacemaker scar Musculoskeletal:     Right lower leg: No edema.     Left lower leg: No edema.             Visit  diagnoses:   ICD-10-CM   1. Coronary artery disease involving native coronary artery of native heart without angina pectoris  I25.10 EKG 12-Lead    rosuvastatin (CRESTOR) 40 MG tablet    Lipid panel    Lipid panel    2. AV block  I44.30     3. Mixed hyperlipidemia  E78.2        Current Outpatient Medications:    amLODipine (NORVASC) 10 MG tablet, Take 1 tablet (10 mg total) by mouth daily., Disp: 30 tablet, Rfl: 2   aspirin EC 81 MG tablet, Take 1 tablet (81 mg total) by mouth daily. Swallow whole., Disp: 30 tablet, Rfl: 3   clopidogrel (PLAVIX) 75 MG tablet, Take 1 tablet (75 mg total) by mouth daily., Disp: 90 tablet, Rfl: 1   Coenzyme Q10 (COQ10 PO), Take 1 capsule by mouth every morning., Disp: , Rfl:    Eszopiclone 3 MG TABS, TAKE 1 TABLET BY MOUTH IMMEDIATELY BEFORE BEDTIME AS NEEDED, Disp: 30 tablet, Rfl: 1   Misc Natural Products (NEURIVA) CAPS, Take 1 capsule by mouth every morning., Disp: , Rfl:    nitroGLYCERIN (NITROSTAT) 0.4 MG SL tablet, Place 1 tablet (0.4 mg total) under the tongue every 5 (five) minutes as needed for chest pain., Disp: 25 tablet, Rfl: 2   rosuvastatin (CRESTOR) 20 MG tablet, Take 1 tablet (20 mg total) by mouth daily., Disp: 90 tablet, Rfl: 1   Assessment & Recommendations:    71 y.o. caucasian male  with CAD s/p RCA PCI for severe stenosis, presentation with exertional dyspnea and lightheadedness, AV block-likely combination of degenerative and ischemic, now s/p PPM placement  CAD: S/p RCA PCI for unstable angina, suspected ischemic heart block (06/2021) Completed 6 month DAPT. Okay to stop plavix. Continue Aspirin 81 mg daily. LDL 81 on Crestor 20 mg daily. Increased to 40 mg daily.   Complete AV block: Now /sp PPM  Hypertension: Well controlled  Lipid panel and f/u in 6 months    Nigel Mormon, MD Pager: [phone] Office: [phone]

## 2022-01-14 NOTE — Progress Notes (Signed)
Remote pacemaker transmission.   

## 2022-01-28 ENCOUNTER — Other Ambulatory Visit: Payer: Self-pay | Admitting: Cardiology

## 2022-01-28 DIAGNOSIS — I2510 Atherosclerotic heart disease of native coronary artery without angina pectoris: Secondary | ICD-10-CM

## 2022-02-01 ENCOUNTER — Other Ambulatory Visit: Payer: Self-pay

## 2022-02-01 ENCOUNTER — Other Ambulatory Visit (HOSPITAL_COMMUNITY): Payer: Self-pay

## 2022-02-01 DIAGNOSIS — I2510 Atherosclerotic heart disease of native coronary artery without angina pectoris: Secondary | ICD-10-CM

## 2022-02-01 MED ORDER — AMLODIPINE BESYLATE 10 MG PO TABS
10.0000 mg | ORAL_TABLET | Freq: Every day | ORAL | 3 refills | Status: DC
  Filled 2022-02-01: qty 90, 90d supply, fill #0
  Filled 2022-04-07 – 2022-04-20 (×2): qty 90, 90d supply, fill #1
  Filled 2022-07-26: qty 90, 90d supply, fill #2
  Filled 2022-11-08: qty 90, 90d supply, fill #3

## 2022-03-18 DIAGNOSIS — Z0001 Encounter for general adult medical examination with abnormal findings: Secondary | ICD-10-CM | POA: Diagnosis not present

## 2022-03-22 ENCOUNTER — Ambulatory Visit (INDEPENDENT_AMBULATORY_CARE_PROVIDER_SITE_OTHER): Payer: Medicare HMO

## 2022-03-22 DIAGNOSIS — I442 Atrioventricular block, complete: Secondary | ICD-10-CM | POA: Diagnosis not present

## 2022-03-23 LAB — CUP PACEART REMOTE DEVICE CHECK
Battery Remaining Longevity: 135 mo
Battery Voltage: 3.08 V
Brady Statistic AP VP Percent: 10.23 %
Brady Statistic AP VS Percent: 0.05 %
Brady Statistic AS VP Percent: 80.83 %
Brady Statistic AS VS Percent: 8.89 %
Brady Statistic RA Percent Paced: 12.81 %
Brady Statistic RV Percent Paced: 91.06 %
Date Time Interrogation Session: 20240114192103
Implantable Lead Connection Status: 753985
Implantable Lead Connection Status: 753985
Implantable Lead Implant Date: 20230411
Implantable Lead Implant Date: 20230411
Implantable Lead Location: 753859
Implantable Lead Location: 753860
Implantable Lead Model: 3830
Implantable Lead Model: 5076
Implantable Pulse Generator Implant Date: 20230411
Lead Channel Impedance Value: 323 Ohm
Lead Channel Impedance Value: 418 Ohm
Lead Channel Impedance Value: 570 Ohm
Lead Channel Impedance Value: 570 Ohm
Lead Channel Pacing Threshold Amplitude: 0.625 V
Lead Channel Pacing Threshold Amplitude: 0.625 V
Lead Channel Pacing Threshold Pulse Width: 0.4 ms
Lead Channel Pacing Threshold Pulse Width: 0.4 ms
Lead Channel Sensing Intrinsic Amplitude: 3.25 mV
Lead Channel Sensing Intrinsic Amplitude: 3.25 mV
Lead Channel Sensing Intrinsic Amplitude: 31.625 mV
Lead Channel Sensing Intrinsic Amplitude: 31.625 mV
Lead Channel Setting Pacing Amplitude: 1.5 V
Lead Channel Setting Pacing Amplitude: 2.5 V
Lead Channel Setting Pacing Pulse Width: 0.4 ms
Lead Channel Setting Sensing Sensitivity: 1.2 mV
Zone Setting Status: 755011
Zone Setting Status: 755011

## 2022-04-02 ENCOUNTER — Other Ambulatory Visit (HOSPITAL_COMMUNITY): Payer: Self-pay

## 2022-04-08 ENCOUNTER — Other Ambulatory Visit (HOSPITAL_COMMUNITY): Payer: Self-pay

## 2022-04-20 ENCOUNTER — Other Ambulatory Visit (HOSPITAL_COMMUNITY): Payer: Self-pay

## 2022-04-22 ENCOUNTER — Other Ambulatory Visit (HOSPITAL_COMMUNITY): Payer: Self-pay

## 2022-04-26 ENCOUNTER — Other Ambulatory Visit (HOSPITAL_COMMUNITY): Payer: Self-pay

## 2022-05-12 NOTE — Progress Notes (Signed)
Remote pacemaker transmission.   

## 2022-06-21 ENCOUNTER — Ambulatory Visit (INDEPENDENT_AMBULATORY_CARE_PROVIDER_SITE_OTHER): Payer: Medicare HMO

## 2022-06-21 DIAGNOSIS — I442 Atrioventricular block, complete: Secondary | ICD-10-CM

## 2022-06-21 DIAGNOSIS — G4700 Insomnia, unspecified: Secondary | ICD-10-CM | POA: Diagnosis not present

## 2022-06-21 DIAGNOSIS — Z950 Presence of cardiac pacemaker: Secondary | ICD-10-CM | POA: Diagnosis not present

## 2022-06-21 DIAGNOSIS — I2510 Atherosclerotic heart disease of native coronary artery without angina pectoris: Secondary | ICD-10-CM | POA: Diagnosis not present

## 2022-06-21 DIAGNOSIS — M25619 Stiffness of unspecified shoulder, not elsewhere classified: Secondary | ICD-10-CM | POA: Diagnosis not present

## 2022-06-21 DIAGNOSIS — I10 Essential (primary) hypertension: Secondary | ICD-10-CM | POA: Diagnosis not present

## 2022-06-22 LAB — CUP PACEART REMOTE DEVICE CHECK
Battery Remaining Longevity: 133 mo
Battery Voltage: 3.05 V
Brady Statistic AP VP Percent: 8.26 %
Brady Statistic AP VS Percent: 0.07 %
Brady Statistic AS VP Percent: 82.12 %
Brady Statistic AS VS Percent: 9.55 %
Brady Statistic RA Percent Paced: 10.7 %
Brady Statistic RV Percent Paced: 90.38 %
Date Time Interrogation Session: 20240414230621
Implantable Lead Connection Status: 753985
Implantable Lead Connection Status: 753985
Implantable Lead Implant Date: 20230411
Implantable Lead Implant Date: 20230411
Implantable Lead Location: 753859
Implantable Lead Location: 753860
Implantable Lead Model: 3830
Implantable Lead Model: 5076
Implantable Pulse Generator Implant Date: 20230411
Lead Channel Impedance Value: 342 Ohm
Lead Channel Impedance Value: 418 Ohm
Lead Channel Impedance Value: 456 Ohm
Lead Channel Impedance Value: 570 Ohm
Lead Channel Pacing Threshold Amplitude: 0.5 V
Lead Channel Pacing Threshold Amplitude: 0.625 V
Lead Channel Pacing Threshold Pulse Width: 0.4 ms
Lead Channel Pacing Threshold Pulse Width: 0.4 ms
Lead Channel Sensing Intrinsic Amplitude: 15.75 mV
Lead Channel Sensing Intrinsic Amplitude: 15.75 mV
Lead Channel Sensing Intrinsic Amplitude: 3.875 mV
Lead Channel Sensing Intrinsic Amplitude: 3.875 mV
Lead Channel Setting Pacing Amplitude: 1.5 V
Lead Channel Setting Pacing Amplitude: 2.5 V
Lead Channel Setting Pacing Pulse Width: 0.4 ms
Lead Channel Setting Sensing Sensitivity: 1.2 mV
Zone Setting Status: 755011
Zone Setting Status: 755011

## 2022-07-05 ENCOUNTER — Encounter: Payer: Self-pay | Admitting: Cardiology

## 2022-07-05 ENCOUNTER — Other Ambulatory Visit (HOSPITAL_COMMUNITY): Payer: Self-pay

## 2022-07-05 ENCOUNTER — Ambulatory Visit: Payer: Medicare HMO | Admitting: Cardiology

## 2022-07-05 VITALS — BP 122/72 | HR 73 | Ht 72.0 in | Wt 182.0 lb

## 2022-07-05 DIAGNOSIS — I4430 Unspecified atrioventricular block: Secondary | ICD-10-CM | POA: Diagnosis not present

## 2022-07-05 DIAGNOSIS — I4729 Other ventricular tachycardia: Secondary | ICD-10-CM | POA: Diagnosis not present

## 2022-07-05 DIAGNOSIS — I2510 Atherosclerotic heart disease of native coronary artery without angina pectoris: Secondary | ICD-10-CM | POA: Diagnosis not present

## 2022-07-05 MED ORDER — METOPROLOL TARTRATE 25 MG PO TABS
25.0000 mg | ORAL_TABLET | Freq: Two times a day (BID) | ORAL | 3 refills | Status: DC
  Filled 2022-07-05: qty 180, 90d supply, fill #0

## 2022-07-05 NOTE — Progress Notes (Signed)
Follow up visit  Subjective:   Keith Jones, male    DOB: February 19, 1951, 72 y.o.   MRN: [id number]   HPI  Chief Complaint  Patient presents with   Coronary artery disease involving native coronary artery of   Follow-up     72 y.o. caucasian male  with CAD s/p RCA PCI for severe stenosis, presentation with exertional dyspnea and lightheadedness, AV block-likely combination of degenerative and ischemic, now s/p PPM placement   Patient is doing well, denies chest pain, shortness of breath, palpitations, leg edema, orthopnea, PND, TIA/syncope.  He had an episode of 20 beat NSVT noted on pacemaker remote check.  He does not have any symptoms associated with it.   Current Outpatient Medications:    amLODipine (NORVASC) 10 MG tablet, Take 1 tablet (10 mg total) by mouth daily., Disp: 90 tablet, Rfl: 3   aspirin EC 81 MG tablet, Take 1 tablet (81 mg total) by mouth daily. Swallow whole., Disp: 30 tablet, Rfl: 3   Coenzyme Q10 (COQ10 PO), Take 1 capsule by mouth every morning., Disp: , Rfl:    Eszopiclone 3 MG TABS, TAKE 1 TABLET BY MOUTH IMMEDIATELY BEFORE BEDTIME AS NEEDED, Disp: 30 tablet, Rfl: 1   MELATONIN PO, Take by mouth., Disp: , Rfl:    Misc Natural Products (NEURIVA) CAPS, Take 1 capsule by mouth every morning., Disp: , Rfl:    nitroGLYCERIN (NITROSTAT) 0.4 MG SL tablet, Place 1 tablet (0.4 mg total) under the tongue every 5 (five) minutes as needed for chest pain., Disp: 25 tablet, Rfl: 2   rosuvastatin (CRESTOR) 40 MG tablet, Take 1 tablet (40 mg total) by mouth daily., Disp: 90 tablet, Rfl: 3   Cardiovascular & other pertient studies:  Reviewed external labs and tests, independently interpreted  EKG 07/05/2022: Sinus rhythm 72 bpm Ventricular pacing  Remote device check 06/20/2022 (Per Dr. Lubertha Basque office): Scheduled remote reviewed. Normal device function.  3 VHR during review period, EGMs consistent with NSVT.  Longest lasted > 20 beats, was routed to triage, patient has  known history and asymptomatic.    06/16/2021 (Dr. Ladona Ridgel, [employer]):  1. Successful implantation of a medtronic dual-chamber pacemaker for symptomatic bradycardia due to 2:1 AV block  2. No early apparent complications.     Coronary intervention 06/12/2021: LM: Normal LAD: Normal Lcx: Mild mid Lcx disease RCA: Large, dominant vessel         Mid 80% stenosis   This was felt to be possible etiology for symptomatic ischemic AV conduction disease                Successful percutaneous coronary intervention mRCA        PTCA and overlapping stent placement 4.0X18 & 4.0X15 mm Onyx Frontier drug-eluting stents        Second proximal stent necessitated by proximal edge dissection propagating into prox RCA        Post dilatation with 4.0X12 mm [place] balloon up to 22 atm   Monitor next 24-48 hours to assess rhythm and symptoms to then decide whether patient would need permanent pacemaker. Appreciate EP input.   Recent labs: 12/15/2021: Glucose 88, BUN/Cr 21/1.2. EGFR 65. K 3.9. HbA1C 5.3% Chol 152, TG 94, HDL 54, LDL 81 TSH 2.3  06/17/2021: Glucose 97, BUN/Cr 15/1.04. EGFR >60. Na/K 138/3.4.  H/H 14/41. MCV 87. Platelets 191 HbA1C 5.3% Chol 135, TG 57, HDL 53, LDL 71 TSH 2.2 normal   Review of Systems  Cardiovascular:  Negative for chest pain,  dyspnea on exertion, leg swelling, palpitations and syncope.         Vitals:   07/05/22 1127  BP: 122/72  Pulse: 73  SpO2: 95%     Body mass index is 24.68 kg/m. Filed Weights   07/05/22 1127  Weight: 182 lb (82.6 kg)     Objective:   Physical Exam Vitals and nursing note reviewed.  Constitutional:      General: He is not in acute distress. Neck:     Vascular: No JVD.  Cardiovascular:     Rate and Rhythm: Normal rate and regular rhythm.     Heart sounds: Normal heart sounds. No murmur heard. Pulmonary:     Effort: Pulmonary effort is normal.     Breath sounds: Normal breath sounds. No wheezing or rales.  Chest:      Comments: Well healed pacemaker scar Musculoskeletal:     Right lower leg: No edema.     Left lower leg: No edema.             Visit diagnoses:   ICD-10-CM   1. Coronary artery disease involving native coronary artery of native heart without angina pectoris  I25.10        Current Outpatient Medications:    amLODipine (NORVASC) 10 MG tablet, Take 1 tablet (10 mg total) by mouth daily., Disp: 90 tablet, Rfl: 3   aspirin EC 81 MG tablet, Take 1 tablet (81 mg total) by mouth daily. Swallow whole., Disp: 30 tablet, Rfl: 3   Coenzyme Q10 (COQ10 PO), Take 1 capsule by mouth every morning., Disp: , Rfl:    Eszopiclone 3 MG TABS, TAKE 1 TABLET BY MOUTH IMMEDIATELY BEFORE BEDTIME AS NEEDED, Disp: 30 tablet, Rfl: 1   MELATONIN PO, Take by mouth., Disp: , Rfl:    Misc Natural Products (NEURIVA) CAPS, Take 1 capsule by mouth every morning., Disp: , Rfl:    nitroGLYCERIN (NITROSTAT) 0.4 MG SL tablet, Place 1 tablet (0.4 mg total) under the tongue every 5 (five) minutes as needed for chest pain., Disp: 25 tablet, Rfl: 2   rosuvastatin (CRESTOR) 40 MG tablet, Take 1 tablet (40 mg total) by mouth daily., Disp: 90 tablet, Rfl: 3   Assessment & Recommendations:    72 y.o. caucasian male  with CAD s/p RCA PCI for severe stenosis, presentation with exertional dyspnea and lightheadedness, AV block-likely combination of degenerative and ischemic, now s/p PPM placement  CAD: S/p RCA PCI for unstable angina, suspected ischemic heart block (06/2021) Continue Aspirin 81 mg daily. Continue Crestor 40 mg daily.  His lipid panel checked, he wants to check it with his PCP.  NSVT: Asymptomatic 20 beat NSVT noted on pacemaker monitoring. Start metoprolol tartrate 25 mg twice daily. Continue pacemaker monitoring.  Complete AV block: Now /sp PPM  Hypertension: Well controlled  F/u in 3 months    [name], MD Pager: [phone] Office: [phone]

## 2022-07-09 DIAGNOSIS — M25612 Stiffness of left shoulder, not elsewhere classified: Secondary | ICD-10-CM | POA: Diagnosis not present

## 2022-07-09 DIAGNOSIS — M25611 Stiffness of right shoulder, not elsewhere classified: Secondary | ICD-10-CM | POA: Diagnosis not present

## 2022-07-12 DIAGNOSIS — M25619 Stiffness of unspecified shoulder, not elsewhere classified: Secondary | ICD-10-CM | POA: Diagnosis not present

## 2022-07-23 DIAGNOSIS — M25619 Stiffness of unspecified shoulder, not elsewhere classified: Secondary | ICD-10-CM | POA: Diagnosis not present

## 2022-07-26 DIAGNOSIS — M25619 Stiffness of unspecified shoulder, not elsewhere classified: Secondary | ICD-10-CM | POA: Diagnosis not present

## 2022-07-29 NOTE — Progress Notes (Signed)
Remote pacemaker transmission.   

## 2022-07-30 DIAGNOSIS — M25619 Stiffness of unspecified shoulder, not elsewhere classified: Secondary | ICD-10-CM | POA: Diagnosis not present

## 2022-08-04 DIAGNOSIS — M25619 Stiffness of unspecified shoulder, not elsewhere classified: Secondary | ICD-10-CM | POA: Diagnosis not present

## 2022-08-06 DIAGNOSIS — M25619 Stiffness of unspecified shoulder, not elsewhere classified: Secondary | ICD-10-CM | POA: Diagnosis not present

## 2022-08-11 DIAGNOSIS — M25619 Stiffness of unspecified shoulder, not elsewhere classified: Secondary | ICD-10-CM | POA: Diagnosis not present

## 2022-08-13 DIAGNOSIS — M25619 Stiffness of unspecified shoulder, not elsewhere classified: Secondary | ICD-10-CM | POA: Diagnosis not present

## 2022-08-18 DIAGNOSIS — M25619 Stiffness of unspecified shoulder, not elsewhere classified: Secondary | ICD-10-CM | POA: Diagnosis not present

## 2022-08-23 DIAGNOSIS — M25619 Stiffness of unspecified shoulder, not elsewhere classified: Secondary | ICD-10-CM | POA: Diagnosis not present

## 2022-09-07 DIAGNOSIS — H2513 Age-related nuclear cataract, bilateral: Secondary | ICD-10-CM | POA: Diagnosis not present

## 2022-09-07 DIAGNOSIS — H02834 Dermatochalasis of left upper eyelid: Secondary | ICD-10-CM | POA: Diagnosis not present

## 2022-09-07 DIAGNOSIS — H40053 Ocular hypertension, bilateral: Secondary | ICD-10-CM | POA: Diagnosis not present

## 2022-09-07 DIAGNOSIS — H02831 Dermatochalasis of right upper eyelid: Secondary | ICD-10-CM | POA: Diagnosis not present

## 2022-09-08 DIAGNOSIS — M25619 Stiffness of unspecified shoulder, not elsewhere classified: Secondary | ICD-10-CM | POA: Diagnosis not present

## 2022-09-15 DIAGNOSIS — M25619 Stiffness of unspecified shoulder, not elsewhere classified: Secondary | ICD-10-CM | POA: Diagnosis not present

## 2022-09-17 DIAGNOSIS — M25619 Stiffness of unspecified shoulder, not elsewhere classified: Secondary | ICD-10-CM | POA: Diagnosis not present

## 2022-09-20 ENCOUNTER — Ambulatory Visit (INDEPENDENT_AMBULATORY_CARE_PROVIDER_SITE_OTHER): Payer: Medicare HMO

## 2022-09-20 DIAGNOSIS — I442 Atrioventricular block, complete: Secondary | ICD-10-CM | POA: Diagnosis not present

## 2022-09-20 DIAGNOSIS — R001 Bradycardia, unspecified: Secondary | ICD-10-CM

## 2022-09-21 LAB — CUP PACEART REMOTE DEVICE CHECK
Battery Remaining Longevity: 129 mo
Battery Voltage: 3.03 V
Brady Statistic AP VP Percent: 47.91 %
Brady Statistic AP VS Percent: 0.1 %
Brady Statistic AS VP Percent: 46.69 %
Brady Statistic AS VS Percent: 5.3 %
Brady Statistic RA Percent Paced: 50.25 %
Brady Statistic RV Percent Paced: 94.6 %
Date Time Interrogation Session: 20240714230159
Implantable Lead Connection Status: 753985
Implantable Lead Connection Status: 753985
Implantable Lead Implant Date: 20230411
Implantable Lead Implant Date: 20230411
Implantable Lead Location: 753859
Implantable Lead Location: 753860
Implantable Lead Model: 3830
Implantable Lead Model: 5076
Implantable Pulse Generator Implant Date: 20230411
Lead Channel Impedance Value: 323 Ohm
Lead Channel Impedance Value: 418 Ohm
Lead Channel Impedance Value: 475 Ohm
Lead Channel Impedance Value: 570 Ohm
Lead Channel Pacing Threshold Amplitude: 0.75 V
Lead Channel Pacing Threshold Amplitude: 0.875 V
Lead Channel Pacing Threshold Pulse Width: 0.4 ms
Lead Channel Pacing Threshold Pulse Width: 0.4 ms
Lead Channel Sensing Intrinsic Amplitude: 3 mV
Lead Channel Sensing Intrinsic Amplitude: 3 mV
Lead Channel Sensing Intrinsic Amplitude: 31.625 mV
Lead Channel Sensing Intrinsic Amplitude: 31.625 mV
Lead Channel Setting Pacing Amplitude: 1.5 V
Lead Channel Setting Pacing Amplitude: 2.5 V
Lead Channel Setting Pacing Pulse Width: 0.4 ms
Lead Channel Setting Sensing Sensitivity: 1.2 mV
Zone Setting Status: 755011
Zone Setting Status: 755011

## 2022-09-29 DIAGNOSIS — M25619 Stiffness of unspecified shoulder, not elsewhere classified: Secondary | ICD-10-CM | POA: Diagnosis not present

## 2022-10-04 ENCOUNTER — Ambulatory Visit: Payer: Medicare HMO | Admitting: Cardiology

## 2022-10-06 NOTE — Progress Notes (Signed)
Remote pacemaker transmission.   

## 2022-10-15 ENCOUNTER — Encounter: Payer: Self-pay | Admitting: Cardiology

## 2022-10-15 ENCOUNTER — Ambulatory Visit: Payer: Medicare HMO | Admitting: Cardiology

## 2022-10-15 VITALS — BP 109/84 | HR 78 | Resp 16 | Ht 72.0 in | Wt 182.0 lb

## 2022-10-15 DIAGNOSIS — I2510 Atherosclerotic heart disease of native coronary artery without angina pectoris: Secondary | ICD-10-CM | POA: Diagnosis not present

## 2022-10-15 DIAGNOSIS — I4729 Other ventricular tachycardia: Secondary | ICD-10-CM | POA: Diagnosis not present

## 2022-10-15 DIAGNOSIS — I442 Atrioventricular block, complete: Secondary | ICD-10-CM | POA: Diagnosis not present

## 2022-10-15 NOTE — Progress Notes (Unsigned)
Follow up visit  Subjective:   Keith Jones, male    DOB: Nov 02, 1950, 72 y.o.   MRN: [id number]   HPI  No chief complaint on file.    72 y.o. caucasian male  with CAD s/p RCA PCI for severe stenosis, presentation with exertional dyspnea and lightheadedness, AV block-likely combination of degenerative and ischemic, now s/p PPM placement   ***Patient is doing well, denies chest pain, shortness of breath, palpitations, leg edema, orthopnea, PND, TIA/syncope.  He had an episode of 20 beat NSVT noted on pacemaker remote check.  He does not have any symptoms associated with it.   Current Outpatient Medications:    amLODipine (NORVASC) 10 MG tablet, Take 1 tablet (10 mg total) by mouth daily., Disp: 90 tablet, Rfl: 3   aspirin EC 81 MG tablet, Take 1 tablet (81 mg total) by mouth daily. Swallow whole., Disp: 30 tablet, Rfl: 3   Coenzyme Q10 (COQ10 PO), Take 1 capsule by mouth every morning., Disp: , Rfl:    Eszopiclone 3 MG TABS, TAKE 1 TABLET BY MOUTH IMMEDIATELY BEFORE BEDTIME AS NEEDED, Disp: 30 tablet, Rfl: 1   MELATONIN PO, Take by mouth., Disp: , Rfl:    metoprolol tartrate (LOPRESSOR) 25 MG tablet, Take 1 tablet (25 mg total) by mouth 2 (two) times daily., Disp: 180 tablet, Rfl: 3   Misc Natural Products (NEURIVA) CAPS, Take 1 capsule by mouth every morning., Disp: , Rfl:    nitroGLYCERIN (NITROSTAT) 0.4 MG SL tablet, Place 1 tablet (0.4 mg total) under the tongue every 5 (five) minutes as needed for chest pain., Disp: 25 tablet, Rfl: 2   rosuvastatin (CRESTOR) 40 MG tablet, Take 1 tablet (40 mg total) by mouth daily., Disp: 90 tablet, Rfl: 3   Cardiovascular & other pertient studies:  Reviewed external labs and tests, independently interpreted  EKG 07/05/2022: Sinus rhythm 72 bpm Ventricular pacing  Remote device check 06/20/2022 (Per Dr. Lubertha Basque office): Scheduled remote reviewed. Normal device function.  3 VHR during review period, EGMs consistent with NSVT.  Longest lasted  > 20 beats, was routed to triage, patient has known history and asymptomatic.    06/16/2021 (Dr. Ladona Ridgel, [employer]):  1. Successful implantation of a [employer] dual-chamber pacemaker for symptomatic bradycardia due to 2:1 AV block  2. No early apparent complications.     Coronary intervention 06/12/2021: LM: Normal LAD: Normal Lcx: Mild mid Lcx disease RCA: Large, dominant vessel         Mid 80% stenosis   This was felt to be possible etiology for symptomatic ischemic AV conduction disease                Successful percutaneous coronary intervention mRCA        PTCA and overlapping stent placement 4.0X18 & 4.0X15 mm Onyx Frontier drug-eluting stents        Second proximal stent necessitated by proximal edge dissection propagating into prox RCA        Post dilatation with 4.0X12 mm [place] balloon up to 22 atm   Monitor next 24-48 hours to assess rhythm and symptoms to then decide whether patient would need permanent pacemaker. Appreciate EP input.   Recent labs: 12/15/2021: Glucose 88, BUN/Cr 21/1.2. EGFR 65. K 3.9. HbA1C 5.3% Chol 152, TG 94, HDL 54, LDL 81 TSH 2.3  06/17/2021: Glucose 97, BUN/Cr 15/1.04. EGFR >60. Na/K 138/3.4.  H/H 14/41. MCV 87. Platelets 191 HbA1C 5.3% Chol 135, TG 57, HDL 53, LDL 71 TSH 2.2 normal  Review of Systems  Cardiovascular:  Negative for chest pain, dyspnea on exertion, leg swelling, palpitations and syncope.         Vitals:   10/15/22 1243  BP: 109/84  Pulse: 78  Resp: 16  SpO2: 94%      Body mass index is 24.68 kg/m. Filed Weights   10/15/22 1243  Weight: 182 lb (82.6 kg)      Objective:   Physical Exam Vitals and nursing note reviewed.  Constitutional:      General: He is not in acute distress. Neck:     Vascular: No JVD.  Cardiovascular:     Rate and Rhythm: Normal rate and regular rhythm.     Heart sounds: Normal heart sounds. No murmur heard. Pulmonary:     Effort: Pulmonary effort is normal.     Breath sounds:  Normal breath sounds. No wheezing or rales.  Chest:     Comments: Well healed pacemaker scar Musculoskeletal:     Right lower leg: No edema.     Left lower leg: No edema.             Visit diagnoses: No diagnosis found.    Current Outpatient Medications:    amLODipine (NORVASC) 10 MG tablet, Take 1 tablet (10 mg total) by mouth daily., Disp: 90 tablet, Rfl: 3   aspirin EC 81 MG tablet, Take 1 tablet (81 mg total) by mouth daily. Swallow whole., Disp: 30 tablet, Rfl: 3   Coenzyme Q10 (COQ10 PO), Take 1 capsule by mouth every morning., Disp: , Rfl:    Eszopiclone 3 MG TABS, TAKE 1 TABLET BY MOUTH IMMEDIATELY BEFORE BEDTIME AS NEEDED, Disp: 30 tablet, Rfl: 1   MELATONIN PO, Take by mouth., Disp: , Rfl:    metoprolol tartrate (LOPRESSOR) 25 MG tablet, Take 1 tablet (25 mg total) by mouth 2 (two) times daily., Disp: 180 tablet, Rfl: 3   Misc Natural Products (NEURIVA) CAPS, Take 1 capsule by mouth every morning., Disp: , Rfl:    nitroGLYCERIN (NITROSTAT) 0.4 MG SL tablet, Place 1 tablet (0.4 mg total) under the tongue every 5 (five) minutes as needed for chest pain., Disp: 25 tablet, Rfl: 2   rosuvastatin (CRESTOR) 40 MG tablet, Take 1 tablet (40 mg total) by mouth daily., Disp: 90 tablet, Rfl: 3   Assessment & Recommendations:    72 y.o. caucasian male  with CAD s/p RCA PCI for severe stenosis, presentation with exertional dyspnea and lightheadedness, AV block-likely combination of degenerative and ischemic, now s/p PPM placement  CAD: S/p RCA PCI for unstable angina, suspected ischemic heart block (06/2021) Continue Aspirin 81 mg daily. Continue Crestor 40 mg daily.  His lipid panel checked, he wants to check it with his PCP.  NSVT: Asymptomatic 20 beat NSVT noted on pacemaker monitoring. Start metoprolol tartrate 25 mg twice daily. Continue pacemaker monitoring.  Complete AV block: Now /sp PPM  Hypertension: Well controlled  F/u in 3 months    [name], MD Pager: [phone] Office: [phone]

## 2022-10-16 ENCOUNTER — Encounter: Payer: Self-pay | Admitting: Cardiology

## 2022-10-19 DIAGNOSIS — L218 Other seborrheic dermatitis: Secondary | ICD-10-CM | POA: Diagnosis not present

## 2022-10-19 DIAGNOSIS — L718 Other rosacea: Secondary | ICD-10-CM | POA: Diagnosis not present

## 2022-10-19 DIAGNOSIS — Z08 Encounter for follow-up examination after completed treatment for malignant neoplasm: Secondary | ICD-10-CM | POA: Diagnosis not present

## 2022-10-19 DIAGNOSIS — D225 Melanocytic nevi of trunk: Secondary | ICD-10-CM | POA: Diagnosis not present

## 2022-10-19 DIAGNOSIS — L814 Other melanin hyperpigmentation: Secondary | ICD-10-CM | POA: Diagnosis not present

## 2022-10-19 DIAGNOSIS — L821 Other seborrheic keratosis: Secondary | ICD-10-CM | POA: Diagnosis not present

## 2022-10-19 DIAGNOSIS — Z85828 Personal history of other malignant neoplasm of skin: Secondary | ICD-10-CM | POA: Diagnosis not present

## 2022-10-28 ENCOUNTER — Ambulatory Visit: Payer: Medicare HMO | Admitting: Cardiology

## 2022-11-08 ENCOUNTER — Other Ambulatory Visit: Payer: Self-pay

## 2022-11-08 ENCOUNTER — Other Ambulatory Visit: Payer: Self-pay | Admitting: Cardiology

## 2022-11-08 ENCOUNTER — Other Ambulatory Visit (HOSPITAL_COMMUNITY): Payer: Self-pay

## 2022-11-08 DIAGNOSIS — I2510 Atherosclerotic heart disease of native coronary artery without angina pectoris: Secondary | ICD-10-CM

## 2022-11-09 ENCOUNTER — Other Ambulatory Visit (HOSPITAL_COMMUNITY): Payer: Self-pay

## 2022-11-09 ENCOUNTER — Other Ambulatory Visit: Payer: Self-pay

## 2022-11-09 MED ORDER — ASPIRIN 81 MG PO TBEC
81.0000 mg | DELAYED_RELEASE_TABLET | Freq: Every day | ORAL | 3 refills | Status: AC
  Filled 2022-11-09: qty 30, 30d supply, fill #0

## 2022-11-11 ENCOUNTER — Other Ambulatory Visit (HOSPITAL_COMMUNITY): Payer: Self-pay

## 2022-11-16 ENCOUNTER — Other Ambulatory Visit (HOSPITAL_COMMUNITY): Payer: Self-pay

## 2022-11-16 MED ORDER — ESZOPICLONE 3 MG PO TABS
3.0000 mg | ORAL_TABLET | Freq: Every evening | ORAL | 0 refills | Status: DC | PRN
  Filled 2022-11-16: qty 30, 30d supply, fill #0

## 2022-11-26 DIAGNOSIS — L821 Other seborrheic keratosis: Secondary | ICD-10-CM | POA: Diagnosis not present

## 2022-11-26 DIAGNOSIS — L718 Other rosacea: Secondary | ICD-10-CM | POA: Diagnosis not present

## 2022-11-26 DIAGNOSIS — D485 Neoplasm of uncertain behavior of skin: Secondary | ICD-10-CM | POA: Diagnosis not present

## 2022-12-16 DIAGNOSIS — Z79899 Other long term (current) drug therapy: Secondary | ICD-10-CM | POA: Diagnosis not present

## 2022-12-16 DIAGNOSIS — E7800 Pure hypercholesterolemia, unspecified: Secondary | ICD-10-CM | POA: Diagnosis not present

## 2022-12-20 ENCOUNTER — Ambulatory Visit (INDEPENDENT_AMBULATORY_CARE_PROVIDER_SITE_OTHER): Payer: Medicare HMO

## 2022-12-20 DIAGNOSIS — I442 Atrioventricular block, complete: Secondary | ICD-10-CM

## 2022-12-22 ENCOUNTER — Other Ambulatory Visit (HOSPITAL_COMMUNITY): Payer: Self-pay

## 2022-12-22 LAB — CUP PACEART REMOTE DEVICE CHECK
Battery Remaining Longevity: 125 mo
Battery Voltage: 3.03 V
Brady Statistic AP VP Percent: 9.14 %
Brady Statistic AP VS Percent: 0.01 %
Brady Statistic AS VP Percent: 86.69 %
Brady Statistic AS VS Percent: 4.16 %
Brady Statistic RA Percent Paced: 10.2 %
Brady Statistic RV Percent Paced: 95.83 %
Date Time Interrogation Session: 20241013225901
Implantable Lead Connection Status: 753985
Implantable Lead Connection Status: 753985
Implantable Lead Implant Date: 20230411
Implantable Lead Implant Date: 20230411
Implantable Lead Location: 753859
Implantable Lead Location: 753860
Implantable Lead Model: 3830
Implantable Lead Model: 5076
Implantable Pulse Generator Implant Date: 20230411
Lead Channel Impedance Value: 323 Ohm
Lead Channel Impedance Value: 418 Ohm
Lead Channel Impedance Value: 456 Ohm
Lead Channel Impedance Value: 551 Ohm
Lead Channel Pacing Threshold Amplitude: 0.5 V
Lead Channel Pacing Threshold Amplitude: 0.875 V
Lead Channel Pacing Threshold Pulse Width: 0.4 ms
Lead Channel Pacing Threshold Pulse Width: 0.4 ms
Lead Channel Sensing Intrinsic Amplitude: 3.375 mV
Lead Channel Sensing Intrinsic Amplitude: 3.375 mV
Lead Channel Sensing Intrinsic Amplitude: 31.625 mV
Lead Channel Sensing Intrinsic Amplitude: 31.625 mV
Lead Channel Setting Pacing Amplitude: 1.5 V
Lead Channel Setting Pacing Amplitude: 2.5 V
Lead Channel Setting Pacing Pulse Width: 0.4 ms
Lead Channel Setting Sensing Sensitivity: 1.2 mV
Zone Setting Status: 755011
Zone Setting Status: 755011

## 2023-01-05 NOTE — Progress Notes (Signed)
Remote pacemaker transmission.   

## 2023-01-11 DIAGNOSIS — D1801 Hemangioma of skin and subcutaneous tissue: Secondary | ICD-10-CM | POA: Diagnosis not present

## 2023-01-11 DIAGNOSIS — L570 Actinic keratosis: Secondary | ICD-10-CM | POA: Diagnosis not present

## 2023-01-11 DIAGNOSIS — L718 Other rosacea: Secondary | ICD-10-CM | POA: Diagnosis not present

## 2023-01-11 DIAGNOSIS — L218 Other seborrheic dermatitis: Secondary | ICD-10-CM | POA: Diagnosis not present

## 2023-01-28 ENCOUNTER — Ambulatory Visit: Payer: Medicare HMO | Attending: Internal Medicine | Admitting: Internal Medicine

## 2023-01-28 ENCOUNTER — Encounter: Payer: Self-pay | Admitting: Internal Medicine

## 2023-01-28 VITALS — BP 112/74 | HR 70 | Ht 71.0 in | Wt 186.0 lb

## 2023-01-28 DIAGNOSIS — I442 Atrioventricular block, complete: Secondary | ICD-10-CM | POA: Diagnosis not present

## 2023-01-28 LAB — CUP PACEART INCLINIC DEVICE CHECK
Battery Remaining Longevity: 125 mo
Battery Voltage: 3.02 V
Brady Statistic AP VP Percent: 17.2 %
Brady Statistic AP VS Percent: 0.05 %
Brady Statistic AS VP Percent: 75.59 %
Brady Statistic AS VS Percent: 7.15 %
Brady Statistic RA Percent Paced: 19.28 %
Brady Statistic RV Percent Paced: 92.79 %
Date Time Interrogation Session: 20241122163926
Implantable Lead Connection Status: 753985
Implantable Lead Connection Status: 753985
Implantable Lead Implant Date: 20230411
Implantable Lead Implant Date: 20230411
Implantable Lead Location: 753859
Implantable Lead Location: 753860
Implantable Lead Model: 3830
Implantable Lead Model: 5076
Implantable Pulse Generator Implant Date: 20230411
Lead Channel Impedance Value: 361 Ohm
Lead Channel Impedance Value: 399 Ohm
Lead Channel Impedance Value: 418 Ohm
Lead Channel Impedance Value: 570 Ohm
Lead Channel Pacing Threshold Amplitude: 0.375 V
Lead Channel Pacing Threshold Amplitude: 0.875 V
Lead Channel Pacing Threshold Pulse Width: 0.4 ms
Lead Channel Pacing Threshold Pulse Width: 0.4 ms
Lead Channel Sensing Intrinsic Amplitude: 15 mV
Lead Channel Sensing Intrinsic Amplitude: 2.5 mV
Lead Channel Sensing Intrinsic Amplitude: 31.625 mV
Lead Channel Sensing Intrinsic Amplitude: 4.125 mV
Lead Channel Setting Pacing Amplitude: 1.5 V
Lead Channel Setting Pacing Amplitude: 2.5 V
Lead Channel Setting Pacing Pulse Width: 0.4 ms
Lead Channel Setting Sensing Sensitivity: 1.2 mV
Zone Setting Status: 755011
Zone Setting Status: 755011

## 2023-01-28 NOTE — Patient Instructions (Signed)

## 2023-01-28 NOTE — Progress Notes (Signed)
HPI Mr .Keith Jones returns today for followup. He is a pleasant 72 yo man with a h/o HTN, dyslipidemia, who developed 2:1 AV block. The patient is doing well after PPM insertion. He notes some mild dyspnea but also notes that he was able to walk for over 40 minutes today at a brisk pace. He denies anginal symptoms No edema or syncope.  Allergies  Allergen Reactions   Other Anaphylaxis    Reports he cardiac arrested after having general anesthesia - 2014?     Current Outpatient Medications  Medication Sig Dispense Refill   amLODipine (NORVASC) 10 MG tablet Take 1 tablet (10 mg total) by mouth daily. 90 tablet 3   aspirin EC 81 MG tablet Take 1 tablet (81 mg total) by mouth daily. Swallow whole. 30 tablet 3   Coenzyme Q10 (COQ10 PO) Take 1 capsule by mouth every morning.     eszopiclone 3 MG TABS Take 1 tablet (3 mg total) by mouth immediately before bedtime as needed. 30 tablet 0   MELATONIN PO Take by mouth.     Misc Natural Products (NEURIVA) CAPS Take 1 capsule by mouth every morning.     rosuvastatin (CRESTOR) 40 MG tablet Take 1 tablet (40 mg total) by mouth daily. 90 tablet 3   Eszopiclone 3 MG TABS TAKE 1 TABLET BY MOUTH IMMEDIATELY BEFORE BEDTIME AS NEEDED 30 tablet 1   metoprolol tartrate (LOPRESSOR) 25 MG tablet Take 1 tablet (25 mg total) by mouth 2 (two) times daily. 180 tablet 3   nitroGLYCERIN (NITROSTAT) 0.4 MG SL tablet Place 1 tablet (0.4 mg total) under the tongue every 5 (five) minutes as needed for chest pain. 25 tablet 2   No current facility-administered medications for this visit.     Past Medical History:  Diagnosis Date   BPH (benign prostatic hypertrophy)    Erectile dysfunction    H/O seasonal allergies 11-16-12   allergy shots past, now no problems   Insomnia    RBBB (right bundle branch block with left anterior fascicular block)     ROS:   All systems reviewed and negative except as noted in the HPI.   Past Surgical History:  Procedure  Laterality Date   CORONARY STENT INTERVENTION N/A 06/12/2021   Procedure: CORONARY STENT INTERVENTION;  Surgeon: [name], MD;  Location: [facility];  Service: Cardiovascular;  Laterality: N/A;   CYSTOSCOPY N/A 11/24/2012   Procedure: CYSTOSCOPY;  Surgeon: Kathi Ludwig, MD;  Location: [facility];  Service: Urology;  Laterality: N/A;   LEFT HEART CATH AND CORONARY ANGIOGRAPHY N/A 06/12/2021   Procedure: LEFT HEART CATH AND CORONARY ANGIOGRAPHY;  Surgeon: [name], MD;  Location: [facility];  Service: Cardiovascular;  Laterality: N/A;   PACEMAKER IMPLANT N/A 06/16/2021   Procedure: PACEMAKER IMPLANT;  Surgeon: Marinus Maw, MD;  Location: [facility];  Service: Cardiovascular;  Laterality: N/A;   TONSILLECTOMY     TRANSURETHRAL RESECTION OF PROSTATE N/A 11/24/2012   Procedure: TRANSURETHRAL RESECTION OF THE PROSTATE WITH GYRUS INSTRUMENTS;  Surgeon: Kathi Ludwig, MD;  Location: [facility];  Service: Urology;  Laterality: N/A;     Family History  Problem Relation Age of Onset   Brain cancer Mother 18   Prostate cancer Father 40   Other Other        noncontributory     Social History   Socioeconomic History   Marital status: Married    Spouse name:  Not on file   Number of children: 0   Years of education: Not on file   Highest education level: Not on file  Occupational History   Occupation: sedentary    Employer: RETIRED  Tobacco Use   Smoking status: Never   Smokeless tobacco: Never  Vaping Use   Vaping status: Never Used  Substance and Sexual Activity   Alcohol use: Yes    Comment: rare occ.- wine/beer   Drug use: No   Sexual activity: Not on file  Other Topics Concern   Not on file  Social History Narrative   Not on file   Social Determinants of Health   Financial Resource Strain: Not on file  Food Insecurity: Not on file  Transportation Needs: Not on file  Physical Activity: Not on file  Stress: Not on file   Social Connections: Not on file  Intimate Partner Violence: Not on file     BP 112/74   Pulse 70   Ht 5\' 11"  (1.803 m)   Wt 186 lb (84.4 kg)   SpO2 98%   BMI 25.94 kg/m   Physical Exam:  Well appearing NAD HEENT: Unremarkable Neck:  No JVD, no thyromegally Lymphatics:  No adenopathy Back:  No CVA tenderness Lungs:  Clear HEART:  Regular rate rhythm, no murmurs, no rubs, no clicks Abd:  soft, positive bowel sounds, no organomegally, no rebound, no guarding Ext:  2 plus pulses, no edema, no cyanosis, no clubbing Skin:  No rashes no nodules Neuro:  CN II through XII intact, motor grossly intact  EKG  DEVICE  Normal device function.  See PaceArt for details.   Assess/Plan:  Heart block - today he has no escape at 40/min. He is asymptomatic s/p PPM insertion. PPM -his [employer] DDD PM is working normally and leads look good. Dyslipidemia - he will continue statin therapy with crestor. HTN - he will continue amlodipine.    Keith Gowda Viney Acocella,MD

## 2023-02-08 DIAGNOSIS — Z0001 Encounter for general adult medical examination with abnormal findings: Secondary | ICD-10-CM | POA: Diagnosis not present

## 2023-02-14 ENCOUNTER — Other Ambulatory Visit (HOSPITAL_COMMUNITY): Payer: Self-pay

## 2023-02-14 ENCOUNTER — Other Ambulatory Visit: Payer: Self-pay | Admitting: Cardiology

## 2023-02-14 DIAGNOSIS — I2510 Atherosclerotic heart disease of native coronary artery without angina pectoris: Secondary | ICD-10-CM

## 2023-02-14 MED ORDER — AMLODIPINE BESYLATE 10 MG PO TABS
10.0000 mg | ORAL_TABLET | Freq: Every day | ORAL | 2 refills | Status: DC
  Filled 2023-02-14: qty 90, 90d supply, fill #0

## 2023-02-14 MED ORDER — ROSUVASTATIN CALCIUM 40 MG PO TABS
40.0000 mg | ORAL_TABLET | Freq: Every day | ORAL | 2 refills | Status: DC
  Filled 2023-02-14: qty 90, 90d supply, fill #0

## 2023-02-15 ENCOUNTER — Other Ambulatory Visit: Payer: Self-pay

## 2023-02-16 ENCOUNTER — Other Ambulatory Visit (HOSPITAL_COMMUNITY): Payer: Self-pay

## 2023-02-16 MED ORDER — ESZOPICLONE 3 MG PO TABS
3.0000 mg | ORAL_TABLET | Freq: Every evening | ORAL | 1 refills | Status: AC | PRN
  Filled 2023-02-16: qty 30, 30d supply, fill #0

## 2023-03-20 LAB — CUP PACEART REMOTE DEVICE CHECK
Battery Remaining Longevity: 122 mo
Battery Voltage: 3.02 V
Brady Statistic AP VP Percent: 5.83 %
Brady Statistic AP VS Percent: 0.02 %
Brady Statistic AS VP Percent: 89.23 %
Brady Statistic AS VS Percent: 4.92 %
Brady Statistic RA Percent Paced: 7.03 %
Brady Statistic RV Percent Paced: 95.06 %
Date Time Interrogation Session: 20250112192240
Implantable Lead Connection Status: 753985
Implantable Lead Connection Status: 753985
Implantable Lead Implant Date: 20230411
Implantable Lead Implant Date: 20230411
Implantable Lead Location: 753859
Implantable Lead Location: 753860
Implantable Lead Model: 3830
Implantable Lead Model: 5076
Implantable Pulse Generator Implant Date: 20230411
Lead Channel Impedance Value: 342 Ohm
Lead Channel Impedance Value: 399 Ohm
Lead Channel Impedance Value: 418 Ohm
Lead Channel Impedance Value: 551 Ohm
Lead Channel Pacing Threshold Amplitude: 0.5 V
Lead Channel Pacing Threshold Amplitude: 1 V
Lead Channel Pacing Threshold Pulse Width: 0.4 ms
Lead Channel Pacing Threshold Pulse Width: 0.4 ms
Lead Channel Sensing Intrinsic Amplitude: 3.5 mV
Lead Channel Sensing Intrinsic Amplitude: 3.5 mV
Lead Channel Sensing Intrinsic Amplitude: 31.625 mV
Lead Channel Sensing Intrinsic Amplitude: 31.625 mV
Lead Channel Setting Pacing Amplitude: 1.5 V
Lead Channel Setting Pacing Amplitude: 2.5 V
Lead Channel Setting Pacing Pulse Width: 0.4 ms
Lead Channel Setting Sensing Sensitivity: 1.2 mV
Zone Setting Status: 755011
Zone Setting Status: 755011

## 2023-03-21 ENCOUNTER — Ambulatory Visit (INDEPENDENT_AMBULATORY_CARE_PROVIDER_SITE_OTHER): Payer: Medicare HMO

## 2023-03-21 DIAGNOSIS — I442 Atrioventricular block, complete: Secondary | ICD-10-CM

## 2023-04-15 ENCOUNTER — Ambulatory Visit: Payer: Self-pay | Admitting: Cardiology

## 2023-04-18 ENCOUNTER — Encounter: Payer: Self-pay | Admitting: Cardiology

## 2023-04-18 ENCOUNTER — Ambulatory Visit: Payer: Medicare HMO | Attending: Cardiology | Admitting: Cardiology

## 2023-04-18 VITALS — BP 122/80 | HR 78 | Resp 16 | Ht 71.0 in | Wt 189.6 lb

## 2023-04-18 DIAGNOSIS — I2510 Atherosclerotic heart disease of native coronary artery without angina pectoris: Secondary | ICD-10-CM | POA: Diagnosis not present

## 2023-04-18 DIAGNOSIS — I4729 Other ventricular tachycardia: Secondary | ICD-10-CM

## 2023-04-18 NOTE — Progress Notes (Signed)
 Cardiology Office Note:  .   Date:  04/18/2023  ID:  Keith Jones, DOB Mar 01, 1951, MRN [id number] PCP: Lanae Pinal, MD  [facility] Providers Cardiologist:  Fransico Ivy, MD PCP: Lanae Pinal, MD  Chief Complaint  Patient presents with   Coronary Artery Disease      History of Present Illness: .    Keith Jones is a 73 y.o. male with hypertension, CAD, s/p PPM for high grade AV block, NSVT  Patient is doing well without any chest pain or shortness of breath.  He does modest physical activity, tries to walk at least 4000 steps daily, does not have any symptoms of the same.  Reviewed remote pacemaker check showed at least 1 episode of NSVT up to 20 beats.   Vitals:   04/18/23 1546  BP: 122/80  Pulse: 78  Resp: 16  SpO2: 97%     ROS:  Review of Systems  Cardiovascular:  Negative for chest pain, dyspnea on exertion, leg swelling, palpitations and syncope.     Studies Reviewed: Keith Jones       Scheduled remote reviewed. Normal device function.   There was one NSVT that was greater than 20 beats that occurred on 03/17/2023, episode #9, sent to triage  Next remote 91 days.   EKG 04/18/2023: Atrial-sensed ventricular-paced rhythm with prolonged AV conduction When compared with ECG of 17-Jun-2021 04:23, No significant change was found  Independently interpreted 12/2022: Chol 134, TG 70, HDL 60, LDL 60 HbA1C 5.3% Hb 15.3    Physical Exam:   Physical Exam Vitals and nursing note reviewed.  Constitutional:      General: He is not in acute distress. Neck:     Vascular: No JVD.  Cardiovascular:     Rate and Rhythm: Normal rate and regular rhythm.     Heart sounds: Normal heart sounds. No murmur heard. Pulmonary:     Effort: Pulmonary effort is normal.     Breath sounds: Normal breath sounds. No wheezing or rales.  Musculoskeletal:     Right lower leg: No edema.     Left lower leg: No edema.      VISIT DIAGNOSES:   ICD-10-CM   1. Coronary  artery disease involving native coronary artery of native heart without angina pectoris  I25.10 EKG 12-Lead    2. NSVT (nonsustained ventricular tachycardia) (HCC)  I47.29        ASSESSMENT AND PLAN: .    Keith Jones is a 73 y.o. male with hypertension, CAD, s/p PPM for high grade AV block, NSVT  CAD: PCI to RCA in 06/2021. No angina symptoms. S/p RCA PCI for unstable angina, suspected ischemic heart block (06/2021) Continue Aspirin  81 mg daily. Continue Crestor  40 mg daily.  Lipids well controlled.   NSVT: Patient continues to have asymptomatic episodes of NSVT, without symptoms of chest pain, palpitations, presyncope, or syncope. Recommend Lexiscan  nuclear stress test and echocardiogram. Continue metoprolol  tartrate 25 mg daily. If above testing showing significant abnormality, we will address the same. If no significant cause found, and if he continues to have NSVT episodes, we will then consider EP referral.  Complete AV block: Now /sp PPM   Hypertension: Well controlled   Informed Consent   Shared Decision Making/Informed Consent The risks [chest pain, shortness of breath, cardiac arrhythmias, dizziness, blood pressure fluctuations, myocardial infarction, stroke/transient ischemic attack, nausea, vomiting, allergic reaction, radiation exposure, metallic taste sensation and life-threatening complications (estimated to be 1 in 10,000)], benefits (risk stratification, diagnosing  coronary artery disease, treatment guidance) and alternatives of a nuclear stress test were discussed in detail with Keith Jones and he agrees to proceed.       No orders of the defined types were placed in this encounter.    F/u in 3 months  Signed, Cody Das, MD

## 2023-04-18 NOTE — Patient Instructions (Signed)
 Testing/Procedures: ECHO  Your physician has requested that you have an echocardiogram. Echocardiography is a painless test that uses sound waves to create images of your heart. It provides your doctor with information about the size and shape of your heart and how well your heart's chambers and valves are working. This procedure takes approximately one hour. There are no restrictions for this procedure. Please do NOT wear cologne, perfume, aftershave, or lotions (deodorant is allowed). Please arrive 15 minutes prior to your appointment time.  Please note: We ask at that you not bring children with you during ultrasound (echo/ vascular) testing. Due to room size and safety concerns, children are not allowed in the ultrasound rooms during exams. Our front office staff cannot provide observation of children in our lobby area while testing is being conducted. An adult accompanying a patient to their appointment will only be allowed in the ultrasound room at the discretion of the ultrasound technician under special circumstances. We apologize for any inconvenience.   LEXISCAN   Your physician has requested that you have a lexiscan  myoview . For further information please visit [email or web address] Please follow instruction sheet, as given.    Follow-Up: At [facility], you and your health needs are our priority.  As part of our continuing mission to provide you with exceptional heart care, we have created designated Provider Care Teams.  These Care Teams include your primary Cardiologist (physician) and Advanced Practice Providers (APPs -  Physician Assistants and Nurse Practitioners) who all work together to provide you with the care you need, when you need it.  Your next appointment:   3 month(s)  Provider:   Cody Das, MD     Other Instructions   1st Floor: - Lobby - Registration  - Pharmacy  - Lab - Cafe  2nd Floor: - PV Lab - Diagnostic Testing (echo, CT, nuclear  med)  3rd Floor: - Vacant  4th Floor: - TCTS (cardiothoracic surgery) - AFib Clinic - Structural Heart Clinic - Vascular Surgery  - Vascular Ultrasound  5th Floor: - [facility] Cardiology (general and EP) - Clinical Pharmacy for coumadin, hypertension, lipid, weight-loss medications, and med management appointments    Valet parking services will be available as well.

## 2023-04-27 ENCOUNTER — Other Ambulatory Visit: Payer: Self-pay

## 2023-04-27 ENCOUNTER — Ambulatory Visit (HOSPITAL_COMMUNITY)
Admission: EM | Admit: 2023-04-27 | Discharge: 2023-04-27 | Disposition: A | Discharge status: Home / Self Care | Payer: Medicare HMO | Attending: Family Medicine | Admitting: Family Medicine

## 2023-04-27 ENCOUNTER — Encounter (HOSPITAL_COMMUNITY): Payer: Self-pay | Admitting: *Deleted

## 2023-04-27 DIAGNOSIS — K645 Perianal venous thrombosis: Secondary | ICD-10-CM | POA: Insufficient documentation

## 2023-04-27 DIAGNOSIS — K625 Hemorrhage of anus and rectum: Secondary | ICD-10-CM | POA: Diagnosis not present

## 2023-04-27 LAB — CBC WITH DIFFERENTIAL/PLATELET
Abs Immature Granulocytes: 0.02 10*3/uL (ref 0.00–0.07)
Basophils Absolute: 0 10*3/uL (ref 0.0–0.1)
Basophils Relative: 0 %
Eosinophils Absolute: 0.1 10*3/uL (ref 0.0–0.5)
Eosinophils Relative: 2 %
HCT: 45.2 % (ref 39.0–52.0)
Hemoglobin: 15.5 g/dL (ref 13.0–17.0)
Immature Granulocytes: 0 %
Lymphocytes Relative: 23 %
Lymphs Abs: 1.7 10*3/uL (ref 0.7–4.0)
MCH: 29.5 pg (ref 26.0–34.0)
MCHC: 34.3 g/dL (ref 30.0–36.0)
MCV: 85.9 fL (ref 80.0–100.0)
Monocytes Absolute: 0.7 10*3/uL (ref 0.1–1.0)
Monocytes Relative: 9 %
Neutro Abs: 5 10*3/uL (ref 1.7–7.7)
Neutrophils Relative %: 66 %
Platelets: 185 10*3/uL (ref 150–400)
RBC: 5.26 MIL/uL (ref 4.22–5.81)
RDW: 13.2 % (ref 11.5–15.5)
WBC: 7.6 10*3/uL (ref 4.0–10.5)
nRBC: 0 % (ref 0.0–0.2)

## 2023-04-27 NOTE — ED Triage Notes (Signed)
PT reports he has internal bleeding . Pt reports blood occurred after having seen blood after he had a BM .  Pt takes SAS.

## 2023-04-27 NOTE — Discharge Instructions (Signed)
You were seen today for rectal bleeding.  You appear to have an external thrombosed hemorrhoid causing your bleeding.  I have ordered blood work today, and this will be resulted later today.  A nurse will call you with results if abnormal.  I have placed a referral to the colo-rectal surgeons.  You may call [facility] at [phone] to make an appointment.  If you have continued bleeding, develop dizziness, or if your lab work is concerning/abnormal, then please go to the ER for further evaluation.

## 2023-04-27 NOTE — ED Triage Notes (Signed)
Patient presents with c/o rectal bleeding that started today. Denies constipation, dizziness or any other symptoms at this time.

## 2023-04-27 NOTE — ED Provider Notes (Signed)
[facility]    CSN: [id number] Arrival date & time: 04/27/23  1144      History   Chief Complaint Chief Complaint  Patient presents with   Rectal Bleeding    HPI Keith Jones is a 73 y.o. male.    Rectal Bleeding Associated symptoms: no abdominal pain and no vomiting    Patient is here as he noted blood this morning with a BM.  He thought this was due to a hemorrhoid that he has had for the last week or so.  He has had slight blood this week, on the toilet paper.  However, this  morning he noted the water was bloody, which was more than normal.  He otherwise feels well.  No dizziness.  No abdominal pain.  He did put toilet paper there and imagines there may be blood on the toilet paper.  He takes an ASA daily, but no other blood thinners.       Past Medical History:  Diagnosis Date   BPH (benign prostatic hypertrophy)    Erectile dysfunction    H/O seasonal allergies 11-16-12   allergy shots past, now no problems   Insomnia    RBBB (right bundle branch block with left anterior fascicular block)     Patient Active Problem List   Diagnosis Date Noted   NSVT (nonsustained ventricular tachycardia) (HCC) 07/05/2022   Mixed hyperlipidemia 01/04/2022   Pacemaker 10/06/2021   Coronary artery disease involving native coronary artery of native heart without angina pectoris 07/03/2021   Bradycardia    Unstable angina (HCC)    AV block, complete (HCC) 06/11/2021   Heart block 06/11/2021   Atrial flutter (HCC) 11/24/2012   CARDIOMYOPATHY 02/04/2010   SHORTNESS OF BREATH 02/04/2010   RBBB 02/03/2010   INSOMNIA 02/03/2010   ERECTILE DYSFUNCTION, ORGANIC, HX OF 02/03/2010    Past Surgical History:  Procedure Laterality Date   CORONARY STENT INTERVENTION N/A 06/12/2021   Procedure: CORONARY STENT INTERVENTION;  Surgeon: [name], MD;  Location: [facility];  Service: Cardiovascular;  Laterality: N/A;   CYSTOSCOPY N/A 11/24/2012   Procedure:  CYSTOSCOPY;  Surgeon: Kathi Ludwig, MD;  Location: [facility];  Service: Urology;  Laterality: N/A;   LEFT HEART CATH AND CORONARY ANGIOGRAPHY N/A 06/12/2021   Procedure: LEFT HEART CATH AND CORONARY ANGIOGRAPHY;  Surgeon: [name], MD;  Location: [facility];  Service: Cardiovascular;  Laterality: N/A;   PACEMAKER IMPLANT N/A 06/16/2021   Procedure: PACEMAKER IMPLANT;  Surgeon: Marinus Maw, MD;  Location: [facility];  Service: Cardiovascular;  Laterality: N/A;   TONSILLECTOMY     TRANSURETHRAL RESECTION OF PROSTATE N/A 11/24/2012   Procedure: TRANSURETHRAL RESECTION OF THE PROSTATE WITH GYRUS INSTRUMENTS;  Surgeon: Kathi Ludwig, MD;  Location: [facility];  Service: Urology;  Laterality: N/A;       Home Medications    Prior to Admission medications   Medication Sig Start Date End Date Taking? Authorizing Provider  amLODipine (NORVASC) 10 MG tablet Take 1 tablet (10 mg total) by mouth daily. 02/14/23  Yes Patwardhan, Anabel Bene, MD  aspirin EC 81 MG tablet Take 1 tablet (81 mg total) by mouth daily. Swallow whole. 11/09/22  Yes Patwardhan, Manish J, MD  rosuvastatin (CRESTOR) 40 MG tablet Take 1 tablet (40 mg total) by mouth daily. 02/14/23  Yes Patwardhan, Manish J, MD  Coenzyme Q10 (COQ10 PO) Take 1 capsule by mouth every morning.    [provider]  Eszopiclone 3 MG TABS TAKE 1 TABLET BY MOUTH IMMEDIATELY BEFORE BEDTIME AS NEEDED 03/11/20 04/18/23  Koirala, Dibas, MD  eszopiclone 3 MG TABS Take 1 tablet (3 mg total) by mouth at bedtime as needed immediately before bedtime 02/16/23     MELATONIN PO Take by mouth.    [provider]  metoprolol tartrate (LOPRESSOR) 25 MG tablet Take 1 tablet (25 mg total) by mouth 2 (two) times daily. 07/05/22 04/18/23  Patwardhan, Anabel Bene, MD  Misc Natural Products (NEURIVA) CAPS Take 1 capsule by mouth every morning.    [provider]  nitroGLYCERIN (NITROSTAT) 0.4 MG SL tablet Place 1 tablet (0.4 mg  total) under the tongue every 5 (five) minutes as needed for chest pain. 06/17/21 04/18/23  Patwardhan, Anabel Bene, MD  sildenafil (VIAGRA) 25 MG tablet Take 25 mg by mouth daily as needed for erectile dysfunction.    [provider]    Family History Family History  Problem Relation Age of Onset   Brain cancer Mother 27   Prostate cancer Father 69   Other Other        noncontributory    Social History Social History   Tobacco Use   Smoking status: Never   Smokeless tobacco: Never  Vaping Use   Vaping status: Never Used  Substance Use Topics   Alcohol use: Yes    Comment: rare occ.- wine/beer   Drug use: No     Allergies   Other   Review of Systems Review of Systems  Constitutional: Negative.   HENT: Negative.    Respiratory: Negative.    Cardiovascular: Negative.   Gastrointestinal:  Positive for blood in stool and hematochezia. Negative for abdominal pain, diarrhea, nausea, rectal pain and vomiting.  Genitourinary: Negative.   Musculoskeletal: Negative.   Skin: Negative.   Psychiatric/Behavioral: Negative.       Physical Exam Triage Vital Signs ED Triage Vitals  Encounter Vitals Group     BP 04/27/23 1151 135/77     Systolic BP Percentile --      Diastolic BP Percentile --      Pulse Rate 04/27/23 1151 70     Resp 04/27/23 1151 18     Temp 04/27/23 1151 97.9 F (36.6 C)     Temp Source 04/27/23 1239 Oral     SpO2 04/27/23 1151 99 %     Weight --      Height --      Head Circumference --      Peak Flow --      Pain Score 04/27/23 1149 0     Pain Loc --      Pain Education --      Exclude from Growth Chart --    No data found.  Updated Vital Signs BP 125/83 (BP Location: Left Arm)   Pulse 69   Temp 98.3 F (36.8 C) (Oral)   Resp 18   SpO2 96%   Visual Acuity Right Eye Distance:   Left Eye Distance:   Bilateral Distance:    Right Eye Near:   Left Eye Near:    Bilateral Near:     Physical Exam Constitutional:      General: He  is not in acute distress.    Appearance: Normal appearance. He is normal weight. He is not ill-appearing or toxic-appearing.  Cardiovascular:     Rate and Rhythm: Normal rate and regular rhythm.  Pulmonary:     Effort: Pulmonary effort is normal.  Breath sounds: Normal breath sounds.  Abdominal:     Palpations: Abdomen is soft.     Tenderness: There is no abdominal tenderness. There is no guarding or rebound.  Genitourinary:    Comments: Prior to the exam he has a piece of toilet paper in the buttocks, that is saturated with blood;  There is a large, thrombosed external hemorrhoid at the 6-9o'clock position.  This tender and firm.  No active bleeding, but blood clots are noted;   Musculoskeletal:     Cervical back: Normal range of motion.  Skin:    General: Skin is warm.  Neurological:     General: No focal deficit present.     Mental Status: He is alert.  Psychiatric:        Mood and Affect: Mood normal.      UC Treatments / Results  Labs (all labs ordered are listed, but only abnormal results are displayed) Labs Reviewed  CBC WITH DIFFERENTIAL/PLATELET    EKG   Radiology No results found.  Procedures Procedures (including critical care time)  Medications Ordered in UC Medications - No data to display  Initial Impression / Assessment and Plan / UC Course  I have reviewed the triage vital signs and the nursing notes.  Pertinent labs & imaging results that were available during my care of the patient were reviewed by me and considered in my medical decision making (see chart for details).   Final Clinical Impressions(s) / UC Diagnoses   Final diagnoses:  Thrombosed external hemorrhoid  Rectal bleeding     Discharge Instructions      You were seen today for rectal bleeding.  You appear to have an external thrombosed hemorrhoid causing your bleeding.  I have ordered blood work today, and this will be resulted later today.  A nurse will call you with  results if abnormal.  I have placed a referral to the colo-rectal surgeons.  You may call [facility] at [phone] to make an appointment.  If you have continued bleeding, develop dizziness, or if your lab work is concerning/abnormal, then please go to the ER for further evaluation.     ED Prescriptions   None    PDMP not reviewed this encounter.   Jannifer Franklin, MD 04/27/23 1311

## 2023-04-29 ENCOUNTER — Encounter (HOSPITAL_COMMUNITY): Payer: Self-pay

## 2023-05-02 DIAGNOSIS — K645 Perianal venous thrombosis: Secondary | ICD-10-CM | POA: Diagnosis not present

## 2023-05-03 DIAGNOSIS — M25512 Pain in left shoulder: Secondary | ICD-10-CM | POA: Diagnosis not present

## 2023-05-03 DIAGNOSIS — M25511 Pain in right shoulder: Secondary | ICD-10-CM | POA: Diagnosis not present

## 2023-05-04 ENCOUNTER — Telehealth (HOSPITAL_COMMUNITY): Payer: Self-pay | Admitting: *Deleted

## 2023-05-04 NOTE — Progress Notes (Signed)
 Remote pacemaker transmission.

## 2023-05-04 NOTE — Telephone Encounter (Signed)
 No DPR on file. Left generic message for pt to call back for MPI study.

## 2023-05-12 ENCOUNTER — Ambulatory Visit (HOSPITAL_BASED_OUTPATIENT_CLINIC_OR_DEPARTMENT_OTHER): Payer: Medicare HMO

## 2023-05-12 ENCOUNTER — Ambulatory Visit (HOSPITAL_COMMUNITY): Payer: Medicare HMO | Attending: Cardiovascular Disease

## 2023-05-12 DIAGNOSIS — I252 Old myocardial infarction: Secondary | ICD-10-CM | POA: Diagnosis not present

## 2023-05-12 DIAGNOSIS — R9431 Abnormal electrocardiogram [ECG] [EKG]: Secondary | ICD-10-CM

## 2023-05-12 DIAGNOSIS — I4729 Other ventricular tachycardia: Secondary | ICD-10-CM | POA: Diagnosis not present

## 2023-05-12 DIAGNOSIS — I2510 Atherosclerotic heart disease of native coronary artery without angina pectoris: Secondary | ICD-10-CM | POA: Insufficient documentation

## 2023-05-12 LAB — ECHOCARDIOGRAM COMPLETE
Area-P 1/2: 2.25 cm2
P 1/2 time: 461 ms
S' Lateral: 4.6 cm

## 2023-05-12 LAB — MYOCARDIAL PERFUSION IMAGING
LV dias vol: 154 mL (ref 62–150)
LV sys vol: 103 mL
Nuc Stress EF: 33 %
Peak HR: 97 {beats}/min
Rest HR: 69 {beats}/min
Rest Nuclear Isotope Dose: 10.1 mCi
SDS: 1
SRS: 0
SSS: 1
ST Depression (mm): 0 mm
Stress Nuclear Isotope Dose: 31.8 mCi
TID: 1.07

## 2023-05-12 MED ORDER — TECHNETIUM TC 99M TETROFOSMIN IV KIT
10.1000 | PACK | Freq: Once | INTRAVENOUS | Status: AC | PRN
Start: 2023-05-12 — End: 2023-05-12
  Administered 2023-05-12: 10.1 via INTRAVENOUS

## 2023-05-12 MED ORDER — REGADENOSON 0.4 MG/5ML IV SOLN
0.4000 mg | Freq: Once | INTRAVENOUS | Status: AC
  Administered 2023-05-12: 0.4 mg via INTRAVENOUS

## 2023-05-12 MED ORDER — TECHNETIUM TC 99M TETROFOSMIN IV KIT
31.8000 | PACK | Freq: Once | INTRAVENOUS | Status: AC | PRN
  Administered 2023-05-12: 31.8 via INTRAVENOUS

## 2023-05-17 NOTE — Progress Notes (Signed)
 Heart function is surprisingly lower than before, with evidence of old injury on the heart muscle. This is is important finding in the setting of recurrent ventricular tachycardia episodes noted on pacemaker. Ideally, recommend left and right heart catheterization to further evaluate this. Happy to see him in the office in next few days to further discuss. Okay to add as 7th or 8th patient on my quarter day, if no other slots available.  Thanks MJP

## 2023-05-19 ENCOUNTER — Other Ambulatory Visit (HOSPITAL_COMMUNITY): Payer: Self-pay

## 2023-05-19 ENCOUNTER — Ambulatory Visit: Attending: Cardiology | Admitting: Cardiology

## 2023-05-19 ENCOUNTER — Telehealth: Payer: Self-pay

## 2023-05-19 ENCOUNTER — Telehealth: Payer: Self-pay | Admitting: *Deleted

## 2023-05-19 ENCOUNTER — Encounter: Payer: Self-pay | Admitting: Cardiology

## 2023-05-19 VITALS — BP 120/80 | HR 76 | Ht 71.0 in | Wt 183.8 lb

## 2023-05-19 DIAGNOSIS — I5020 Unspecified systolic (congestive) heart failure: Secondary | ICD-10-CM

## 2023-05-19 DIAGNOSIS — I4729 Other ventricular tachycardia: Secondary | ICD-10-CM

## 2023-05-19 DIAGNOSIS — I2510 Atherosclerotic heart disease of native coronary artery without angina pectoris: Secondary | ICD-10-CM | POA: Diagnosis not present

## 2023-05-19 MED ORDER — BISOPROLOL FUMARATE 5 MG PO TABS
2.5000 mg | ORAL_TABLET | Freq: Every day | ORAL | 1 refills | Status: DC
  Filled 2023-05-19: qty 45, 90d supply, fill #0
  Filled 2023-08-11: qty 45, 90d supply, fill #1

## 2023-05-19 MED ORDER — AMLODIPINE BESYLATE 10 MG PO TABS
10.0000 mg | ORAL_TABLET | Freq: Every day | ORAL | 3 refills | Status: DC
  Filled 2023-05-19: qty 90, 90d supply, fill #0

## 2023-05-19 MED ORDER — ROSUVASTATIN CALCIUM 40 MG PO TABS
40.0000 mg | ORAL_TABLET | Freq: Every day | ORAL | 3 refills | Status: AC
  Filled 2023-05-19: qty 90, 90d supply, fill #0
  Filled 2023-08-11: qty 90, 90d supply, fill #1
  Filled 2023-11-08: qty 90, 90d supply, fill #2
  Filled 2024-02-20: qty 90, 90d supply, fill #3

## 2023-05-19 NOTE — Progress Notes (Signed)
 Cardiology Office Note:  .   Date:  05/19/2023  ID:  Keith Jones, DOB 08-20-1950, MRN [id number] PCP: Darrow Bussing, MD  [facility] Providers Cardiologist:  Truett Mainland, MD PCP: Darrow Bussing, MD  Chief Complaint  Patient presents with   Cardiomyopathy      History of Present Illness: .    Keith Jones is a 73 y.o. male with hypertension, CAD, s/p PPM for high grade AV block, NSVT  Patient presented to [facility] in 06/2021 with exertional dyspnea and fatigue symptoms and was found to have high-grade AV block.  Concurrently, he also had severe proximal/mid RCA stenosis.  With concern for ischemic AV block, he underwent overlapping stents in proximal/mid RCA.  However, he did not have adequate improvement in his conduction, therefore requiring to undergo pacemaker placement.  EF at that time was 40-45%.  Subsequently, patient has not had any symptoms of exertional dyspnea, but continued to show recurrent episodes of ventricular tachycardia.  He was recommended metoprolol, but he stopped taking due to side effects of fatigue.  Given persistent episodes of VT, albeit without symptoms, intermittent echocardiogram and stress testing.  He is here today with his wife to discuss results, see below.  Patient walks a mile and a half regularly without having any symptoms of exertional dyspnea, also denies any orthopnea, PND etc.   Vitals:   05/19/23 1345  BP: 120/80  Pulse: 76  SpO2: 97%      ROS:  Review of Systems  Cardiovascular:  Negative for chest pain, dyspnea on exertion, leg swelling, palpitations and syncope.     Studies Reviewed: Marland Kitchen        EKG 05/19/2023: Atrial-sensed ventricular-paced rhythm with prolonged AV conduction When compared with ECG of 18-Apr-2023 15:48, Vent. rate has increased BY  11 BPM     Independently interpreted 12/2022: Chol 134, TG 70, HDL 60, LDL 60 HbA1C 5.3% Hb 15.3  Echocardiogram 05/12/2023: 1. Left  ventricular ejection fraction, by estimation, is 30 to 35%. The  left ventricle has moderately decreased function. The left ventricle has  no regional wall motion abnormalities. The left ventricular internal  cavity size was moderately dilated.  Left ventricular diastolic parameters are consistent with Grade I  diastolic dysfunction (impaired relaxation).   2. Right ventricular systolic function is normal. The right ventricular  size is normal.   3. Left atrial size was mildly dilated.   4. The mitral valve is normal in structure. Mild to moderate mitral valve  regurgitation. No evidence of mitral stenosis.   5. The aortic valve is tricuspid. There is mild calcification of the  aortic valve. Aortic valve regurgitation is mild. No aortic stenosis is  present.   6. The inferior vena cava is normal in size with greater than 50%  respiratory variability, suggesting right atrial pressure of 3 mmHg.   Conclusion(s)/Recommendation(s): There are frequent PVcs during this  study. The LV is dilated with septal dyssynchony. EF 30-35%.   Stress test 05/12/2023:   Findings are consistent with infarction. The study is high risk.   No ST deviation was noted.   LV perfusion is abnormal. There is no evidence of ischemia. There is evidence of infarction. Defect 1: There is a medium defect with moderate reduction in uptake present in the apical to mid inferior and inferoseptal location(s) that is fixed. There is abnormal wall motion in the defect area. Consistent with infarction.   Left ventricular function is abnormal. Global function is moderately reduced.  Nuclear stress EF: 33%. The left ventricular ejection fraction is moderately decreased (30-44%). End diastolic cavity size is moderately enlarged. End systolic cavity size is moderately enlarged.   Study suggestive of prior inferior infarct.  Additional perfusion defects on supine stress resolves with upright imaging.  High risk study due to LVEF.    Remote pacer check 03/20/2023: Scheduled remote reviewed. Normal device function.   There was one NSVT that was greater than 20 beats that occurred on 03/17/2023, episode #9, sent to triage  Next remote 91 days.   Physical Exam:   Physical Exam Vitals and nursing note reviewed.  Constitutional:      General: He is not in acute distress. Neck:     Vascular: No JVD.  Cardiovascular:     Rate and Rhythm: Normal rate and regular rhythm.     Heart sounds: Normal heart sounds. No murmur heard. Pulmonary:     Effort: Pulmonary effort is normal.     Breath sounds: Normal breath sounds. No wheezing or rales.  Musculoskeletal:     Right lower leg: No edema.     Left lower leg: No edema.     VISIT DIAGNOSES:   ICD-10-CM   1. HFrEF (heart failure with reduced ejection fraction) (HCC)  I50.20 EKG 12-Lead    EKG 12-Lead    2. Coronary artery disease involving native coronary artery of native heart without angina pectoris  I25.10 EKG 12-Lead    EKG 12-Lead    amLODipine (NORVASC) 10 MG tablet    rosuvastatin (CRESTOR) 40 MG tablet    3. NSVT (nonsustained ventricular tachycardia) (HCC)  I47.29 EKG 12-Lead    EKG 12-Lead        ASSESSMENT AND PLAN: .    [name] is a 73 y.o. male with hypertension, CAD, s/p PPM for high grade AV block, NSVT, HFrEF  HFrEF: EF 40-45% in 06/2021 at the time of presentation with symptomatic high-grade AV block. Recent NSVT episodes with echocardiogram 05/2023 showing EF of 30-35% with dilated LV, without regional wall motion abnormalities. Stress test showed fixed defect in apical and mid inferior inferoseptal myocardium suggestive of prior infarct, but no ischemia. It is perplexing that while he has infarct in RCA territory, his wall motion abnormality is global, with dilated LV. It is important to find out if etiology for his cardiomyopathy is ischemic or not. I recommend right and left heart catheterization to assess patency of his right  coronary artery stents.  If stents are occluded, I do not think he will necessarily benefit from revascularization given infarct and no ischemia noted on stress testing.  However, if stents are open, he would mean that infarct occurred at some point possibly even before revascularization, but may point to alternate cause for his cardiomyopathy, including arrhythmia induced cardiomyopathy caused by NSVT. I have reviewed the risks, indications, and alternatives to cardiac catheterization, possible angioplasty, and stenting with the patient. Risks include but are not limited to bleeding, infection, vascular injury, stroke, myocardial infection, arrhythmia, kidney injury, radiation-related injury in the case of prolonged fluoroscopy use, emergency cardiac surgery, and death. The patient understands the risks of serious complication is 1-2 in 1000 with diagnostic cardiac cath and 1-2% or less with angioplasty/stenting.   In the meantime, I will start him on low-dose bisoprolol 2.5 mg daily. After heart catheterization, I proposed to stop amlodipine 10 mg, and instead start losartan 50 mg daily. In absence of symptoms of HFrEF, he may not need Entresto at this time.  In future, we could consider adding SGLT2i, and MRA, as well as uptitrate his medical therapy.  CAD: PCI to RCA in 06/2021. No angina symptoms. S/p RCA PCI for unstable angina, suspected ischemic heart block (06/2021) Continue Aspirin 81 mg daily. Continue Crestor 40 mg daily.  Lipids well controlled.   Complete AV block: Now /sp PPM   Hypertension: Well controlled   Informed Consent   Shared Decision Making/Informed Consent The risks [stroke (1 in 1000), death (1 in 1000), kidney failure [usually temporary] (1 in 500), bleeding (1 in 200), allergic reaction [possibly serious] (1 in 200)], benefits (diagnostic support and management of coronary artery disease) and alternatives of a cardiac catheterization were discussed in detail with  Mr. Nauta and he is willing to proceed.       Meds ordered this encounter  Medications   amLODipine (NORVASC) 10 MG tablet    Sig: Take 1 tablet (10 mg total) by mouth daily.    Dispense:  90 tablet    Refill:  3   rosuvastatin (CRESTOR) 40 MG tablet    Sig: Take 1 tablet (40 mg total) by mouth daily.    Dispense:  90 tablet    Refill:  3     I spent 40 minutes in the care of GIROLAMO LORTIE today including reviewing invasive and noninvasive tests, including echocardiogram 06/2021, heart catheterization 06/2021, echocardiogram 05/2023, stress test 05/2023, pacemaker findings of NSVT, discussing diagnosis of cardiomyopathy, further diagnostic and treatment options, face-to-face discussion and coordinating care, as well as documenting the encounter.   F/u after cath  Signed, [name], MD

## 2023-05-19 NOTE — Telephone Encounter (Signed)
 Spoke with patient and she is aware of echo results. He is scheduled for today to discuss

## 2023-05-19 NOTE — Telephone Encounter (Signed)
-----   Message from Nurse Estelle Grumbles sent at 05/19/2023  2:43 PM EDT ----- Regarding: Left and Right Heart Cath 05/27/23 (Patwardhan) Hello,  Keith Jones is scheduled for a left and right heart cath (CPT [phone]) with Dr. Rosemary Holms on 05/27/2023.  Patient is having CBC, BMET completed today.  Please precert patient for procedure.   Thank you, Demetrios Loll, RN

## 2023-05-19 NOTE — Patient Instructions (Addendum)
 Medication Instructions:   STOP TAKING METOPROLOL NOW  START TAKING BISOPROLOL 2.5 MG BY MOUTH DAILY  *If you need a refill on your cardiac medications before your next appointment, please call your pharmacy*   Lab Work:  TODAY DOWNSTAIRS FIRST FLOOR AT [facility]--BMET, CBC W DIFF, AND PRO-BNP  If you have labs (blood work) drawn today and your tests are completely normal, you will receive your results only by: MyChart Message (if you have MyChart) OR A paper copy in the mail If you have any lab test that is abnormal or we need to change your treatment, we will call you to review the results.   Testing/Procedures:  LEFT/RIGHT HEART CATH FOR DR. PATWARDHAN TO DO NEXT FRIDAY 05/27/23    Follow-Up:  4-6 WEEKS WITH AN EXTENDER OR DR. PATWARDHAN          Cardiac/Peripheral Catheterization   You are scheduled for a Cardiac Catheterization on Friday, March 21 with Dr.  Rosemary Holms .  1. Please arrive at the [facility] (Main Entrance A) at [facility]: [address] [place], [place] [address] at 8:30 AM (This time is 2 hour(s) before your procedure to ensure your preparation).   Free valet parking service is available. You will check in at ADMITTING. The support person will be asked to wait in the waiting room.  It is OK to have someone drop you off and come back when you are ready to be discharged.        Special note: Every effort is made to have your procedure done on time. Please understand that emergencies sometimes delay scheduled procedures.  2. Diet: Do not eat solid foods after midnight.  You may have clear liquids until 5 AM the day of the procedure.  3. Labs: TODAY CBC, BMET  4. Medication instructions in preparation for your procedure:   Contrast Allergy: No  On the morning of your procedure, take Aspirin 81 mg and any morning medicines NOT listed above.  You may use sips of water.  5. Plan to go home the same day, you will only stay overnight if  medically necessary. 6. You MUST have a responsible adult to drive you home. 7. An adult MUST be with you the first 24 hours after you arrive home. 8. Bring a current list of your medications, and the last time and date medication taken. 9. Bring ID and current insurance cards. 10.Please wear clothes that are easy to get on and off and wear slip-on shoes.  Thank you for allowing [place] to care for you!   -- [facility]

## 2023-05-19 NOTE — Telephone Encounter (Signed)
-----   Message from [facility] sent at 05/17/2023  1:58 PM EDT ----- Heart function is surprisingly lower than before, with evidence of old injury on the heart muscle. This is is important finding in the setting of recurrent ventricular tachycardia episodes noted on pacemaker. Ideally, recommend left and right heart catheterization to further evaluate this. Happy to see him in the office in next few days to further discuss. Okay to add as 7th or 8th patient on my quarter day, if no other slots available.  Thanks MJP

## 2023-05-19 NOTE — Telephone Encounter (Signed)
 Left voicemail to return call to office

## 2023-05-20 ENCOUNTER — Telehealth: Payer: Self-pay | Admitting: Cardiology

## 2023-05-20 ENCOUNTER — Other Ambulatory Visit: Payer: Self-pay

## 2023-05-20 DIAGNOSIS — M25512 Pain in left shoulder: Secondary | ICD-10-CM | POA: Diagnosis not present

## 2023-05-20 DIAGNOSIS — M25511 Pain in right shoulder: Secondary | ICD-10-CM | POA: Diagnosis not present

## 2023-05-20 DIAGNOSIS — R7989 Other specified abnormal findings of blood chemistry: Secondary | ICD-10-CM

## 2023-05-20 LAB — CBC WITH DIFFERENTIAL/PLATELET
Basophils Absolute: 0 10*3/uL (ref 0.0–0.2)
Basos: 0 %
EOS (ABSOLUTE): 0.2 10*3/uL (ref 0.0–0.4)
Eos: 3 %
Hematocrit: 47.2 % (ref 37.5–51.0)
Hemoglobin: 15.7 g/dL (ref 13.0–17.7)
Immature Grans (Abs): 0 10*3/uL (ref 0.0–0.1)
Immature Granulocytes: 0 %
Lymphocytes Absolute: 1.8 10*3/uL (ref 0.7–3.1)
Lymphs: 27 %
MCH: 29.5 pg (ref 26.6–33.0)
MCHC: 33.3 g/dL (ref 31.5–35.7)
MCV: 89 fL (ref 79–97)
Monocytes Absolute: 0.6 10*3/uL (ref 0.1–0.9)
Monocytes: 9 %
Neutrophils Absolute: 4.1 10*3/uL (ref 1.4–7.0)
Neutrophils: 61 %
Platelets: 217 10*3/uL (ref 150–450)
RBC: 5.32 x10E6/uL (ref 4.14–5.80)
RDW: 13.1 % (ref 11.6–15.4)
WBC: 6.7 10*3/uL (ref 3.4–10.8)

## 2023-05-20 LAB — BASIC METABOLIC PANEL
BUN/Creatinine Ratio: 13 (ref 10–24)
BUN: 19 mg/dL (ref 8–27)
CO2: 26 mmol/L (ref 20–29)
Calcium: 9.8 mg/dL (ref 8.6–10.2)
Chloride: 100 mmol/L (ref 96–106)
Creatinine, Ser: 1.49 mg/dL — ABNORMAL HIGH (ref 0.76–1.27)
Glucose: 85 mg/dL (ref 70–99)
Potassium: 5.1 mmol/L (ref 3.5–5.2)
Sodium: 140 mmol/L (ref 134–144)
eGFR: 50 mL/min/{1.73_m2} — ABNORMAL LOW (ref >59)

## 2023-05-20 LAB — PRO B NATRIURETIC PEPTIDE: NT-Pro BNP: 186 pg/mL (ref 0–376)

## 2023-05-20 NOTE — Telephone Encounter (Signed)
 Follow Up:       Patient says he was retuning a call from today, but does not know who called.

## 2023-05-20 NOTE — Progress Notes (Signed)
 Not unsurprisingly, proBNP which is a marker of heart condition, is completely normal.  I do think patient is very well compensated even with low heart function.  Creatinine is 1.49, which is higher than baseline from 2 years ago.  It is possible that this is his new baseline, but could also be transiently elevated if he is slightly dehydrated.  Recommend increasing fluid intake.  And repeating BMP, on 3/17 on 3/18.  I would like to see stable or improved creatinine for performing heart catheterization on 3/21.  Thanks MJP

## 2023-05-20 NOTE — Telephone Encounter (Signed)
 Pt contacted and advised lab results. Lab order placed and pt agrees with plan of care.

## 2023-05-23 ENCOUNTER — Other Ambulatory Visit: Payer: Self-pay | Admitting: *Deleted

## 2023-05-23 ENCOUNTER — Encounter: Payer: Self-pay | Admitting: Cardiology

## 2023-05-23 DIAGNOSIS — R7989 Other specified abnormal findings of blood chemistry: Secondary | ICD-10-CM | POA: Diagnosis not present

## 2023-05-23 LAB — BASIC METABOLIC PANEL
BUN/Creatinine Ratio: 11 (ref 10–24)
BUN: 14 mg/dL (ref 8–27)
CO2: 25 mmol/L (ref 20–29)
Calcium: 9.6 mg/dL (ref 8.6–10.2)
Chloride: 102 mmol/L (ref 96–106)
Creatinine, Ser: 1.29 mg/dL — ABNORMAL HIGH (ref 0.76–1.27)
Glucose: 97 mg/dL (ref 70–99)
Potassium: 5 mmol/L (ref 3.5–5.2)
Sodium: 140 mmol/L (ref 134–144)
eGFR: 59 mL/min/{1.73_m2} — ABNORMAL LOW (ref >59)

## 2023-05-26 ENCOUNTER — Telehealth: Payer: Self-pay | Admitting: *Deleted

## 2023-05-26 NOTE — Telephone Encounter (Addendum)
 Cardiac Catheterization scheduled at [facility] for: Friday May 27, 2023 10:30 AM Arrival time [facility] Main Entrance A at: 8:30 AM  Nothing to eat after midnight prior to procedure, clear liquids until 5 AM day of procedure.  Medication instructions: -Usual morning medications can be taken with sips of water including aspirin 81 mg.  Plan to go home the same day, you will only stay overnight if medically necessary.  You must have responsible adult to drive you home.  Someone must be with you the first 24 hours after you arrive home.  Reviewed procedure instructions with patient

## 2023-05-27 ENCOUNTER — Other Ambulatory Visit: Payer: Self-pay

## 2023-05-27 ENCOUNTER — Other Ambulatory Visit (HOSPITAL_COMMUNITY): Payer: Self-pay

## 2023-05-27 ENCOUNTER — Ambulatory Visit (HOSPITAL_COMMUNITY)
Admission: RE | Admit: 2023-05-27 | Discharge: 2023-05-27 | Disposition: A | Discharge status: Home / Self Care | Attending: Cardiology | Admitting: Cardiology

## 2023-05-27 ENCOUNTER — Encounter (HOSPITAL_COMMUNITY): Payer: Self-pay | Admitting: Cardiology

## 2023-05-27 ENCOUNTER — Encounter (HOSPITAL_COMMUNITY)
Admission: RE | Disposition: A | Discharge status: Home / Self Care | Payer: Self-pay | Source: Home / Self Care | Attending: Cardiology

## 2023-05-27 DIAGNOSIS — I442 Atrioventricular block, complete: Secondary | ICD-10-CM | POA: Insufficient documentation

## 2023-05-27 DIAGNOSIS — I2510 Atherosclerotic heart disease of native coronary artery without angina pectoris: Secondary | ICD-10-CM

## 2023-05-27 DIAGNOSIS — Z950 Presence of cardiac pacemaker: Secondary | ICD-10-CM | POA: Diagnosis not present

## 2023-05-27 DIAGNOSIS — Z7982 Long term (current) use of aspirin: Secondary | ICD-10-CM | POA: Insufficient documentation

## 2023-05-27 DIAGNOSIS — I110 Hypertensive heart disease with heart failure: Secondary | ICD-10-CM | POA: Insufficient documentation

## 2023-05-27 DIAGNOSIS — I5022 Chronic systolic (congestive) heart failure: Secondary | ICD-10-CM | POA: Diagnosis not present

## 2023-05-27 DIAGNOSIS — I429 Cardiomyopathy, unspecified: Secondary | ICD-10-CM | POA: Diagnosis not present

## 2023-05-27 DIAGNOSIS — Z79899 Other long term (current) drug therapy: Secondary | ICD-10-CM | POA: Insufficient documentation

## 2023-05-27 DIAGNOSIS — I5020 Unspecified systolic (congestive) heart failure: Secondary | ICD-10-CM

## 2023-05-27 HISTORY — PX: RIGHT/LEFT HEART CATH AND CORONARY ANGIOGRAPHY: CATH118266

## 2023-05-27 LAB — POCT I-STAT EG7
Acid-base deficit: 1 mmol/L (ref 0.0–2.0)
Acid-base deficit: 1 mmol/L (ref 0.0–2.0)
Bicarbonate: 22.8 mmol/L (ref 20.0–28.0)
Bicarbonate: 23.2 mmol/L (ref 20.0–28.0)
Calcium, Ion: 1.02 mmol/L — ABNORMAL LOW (ref 1.15–1.40)
Calcium, Ion: 1.14 mmol/L — ABNORMAL LOW (ref 1.15–1.40)
HCT: 42 % (ref 39.0–52.0)
HCT: 43 % (ref 39.0–52.0)
Hemoglobin: 14.3 g/dL (ref 13.0–17.0)
Hemoglobin: 14.6 g/dL (ref 13.0–17.0)
O2 Saturation: 75 %
O2 Saturation: 93 %
Potassium: 2.9 mmol/L — ABNORMAL LOW (ref 3.5–5.1)
Potassium: 3.2 mmol/L — ABNORMAL LOW (ref 3.5–5.1)
Sodium: 142 mmol/L (ref 135–145)
Sodium: 143 mmol/L (ref 135–145)
TCO2: 24 mmol/L (ref 22–32)
TCO2: 24 mmol/L (ref 22–32)
pCO2, Ven: 35.9 mmHg — ABNORMAL LOW (ref 44–60)
pCO2, Ven: 37.9 mmHg — ABNORMAL LOW (ref 44–60)
pH, Ven: 7.396 (ref 7.25–7.43)
pH, Ven: 7.412 (ref 7.25–7.43)
pO2, Ven: 39 mmHg (ref 32–45)
pO2, Ven: 66 mmHg — ABNORMAL HIGH (ref 32–45)

## 2023-05-27 SURGERY — RIGHT/LEFT HEART CATH AND CORONARY ANGIOGRAPHY
Anesthesia: LOCAL

## 2023-05-27 MED ORDER — ACETAMINOPHEN 325 MG PO TABS: 650.0000 mg | ORAL_TABLET | ORAL | Status: DC | PRN

## 2023-05-27 MED ORDER — HEPARIN SODIUM (PORCINE) 1000 UNIT/ML IJ SOLN
INTRAMUSCULAR | Status: AC
Start: 2023-05-27 — End: ?
  Filled 2023-05-27: qty 10

## 2023-05-27 MED ORDER — HYDRALAZINE HCL 20 MG/ML IJ SOLN: 10.0000 mg | INTRAMUSCULAR | Status: DC | PRN

## 2023-05-27 MED ORDER — SODIUM CHLORIDE 0.9% FLUSH: 3.0000 mL | Freq: Two times a day (BID) | INTRAVENOUS | Status: DC

## 2023-05-27 MED ORDER — FENTANYL CITRATE (PF) 100 MCG/2ML IJ SOLN
INTRAMUSCULAR | Status: AC
  Filled 2023-05-27: qty 2

## 2023-05-27 MED ORDER — HEPARIN SODIUM (PORCINE) 1000 UNIT/ML IJ SOLN
INTRAMUSCULAR | Status: DC | PRN
Start: 2023-05-27 — End: 2023-05-27
  Administered 2023-05-27: 4000 [IU] via INTRAVENOUS

## 2023-05-27 MED ORDER — IOHEXOL 350 MG/ML SOLN
INTRAVENOUS | Status: DC | PRN
  Administered 2023-05-27: 20 mL via INTRA_ARTERIAL

## 2023-05-27 MED ORDER — ONDANSETRON HCL 4 MG/2ML IJ SOLN: 4.0000 mg | Freq: Four times a day (QID) | INTRAMUSCULAR | Status: DC | PRN

## 2023-05-27 MED ORDER — SODIUM CHLORIDE 0.9 % IV SOLN: 250.0000 mL | INTRAVENOUS | Status: DC | PRN

## 2023-05-27 MED ORDER — FENTANYL CITRATE (PF) 100 MCG/2ML IJ SOLN
INTRAMUSCULAR | Status: DC | PRN
  Administered 2023-05-27: 25 ug via INTRAVENOUS

## 2023-05-27 MED ORDER — AMLODIPINE BESYLATE 10 MG PO TABS
5.0000 mg | ORAL_TABLET | Freq: Every day | ORAL | 3 refills | Status: AC
  Filled 2023-05-27: qty 90, 180d supply, fill #0
  Filled 2023-12-15: qty 50, 100d supply, fill #0
  Filled 2024-03-29: qty 50, 100d supply, fill #1

## 2023-05-27 MED ORDER — ASPIRIN 81 MG PO CHEW: 81.0000 mg | CHEWABLE_TABLET | ORAL | Status: DC

## 2023-05-27 MED ORDER — LIDOCAINE HCL (PF) 1 % IJ SOLN
INTRAMUSCULAR | Status: DC | PRN
  Administered 2023-05-27 (×2): 2 mL

## 2023-05-27 MED ORDER — LOSARTAN POTASSIUM 25 MG PO TABS
25.0000 mg | ORAL_TABLET | Freq: Every day | ORAL | 3 refills | Status: AC
  Filled 2023-05-27: qty 90, 90d supply, fill #0
  Filled 2023-08-26: qty 90, 90d supply, fill #1
  Filled 2023-11-08: qty 90, 90d supply, fill #2
  Filled 2024-02-20: qty 90, 90d supply, fill #3

## 2023-05-27 MED ORDER — VERAPAMIL HCL 2.5 MG/ML IV SOLN
INTRAVENOUS | Status: DC | PRN
  Administered 2023-05-27: 10 mL via INTRA_ARTERIAL

## 2023-05-27 MED ORDER — LIDOCAINE HCL (PF) 1 % IJ SOLN
INTRAMUSCULAR | Status: AC
  Filled 2023-05-27: qty 30

## 2023-05-27 MED ORDER — SODIUM CHLORIDE 0.9 % IV SOLN: INTRAVENOUS | Status: DC

## 2023-05-27 MED ORDER — HEPARIN (PORCINE) IN NACL 1000-0.9 UT/500ML-% IV SOLN
INTRAVENOUS | Status: DC | PRN
  Administered 2023-05-27 (×2): 500 mL

## 2023-05-27 MED ORDER — LABETALOL HCL 5 MG/ML IV SOLN: 10.0000 mg | INTRAVENOUS | Status: DC | PRN

## 2023-05-27 MED ORDER — MIDAZOLAM HCL 2 MG/2ML IJ SOLN
INTRAMUSCULAR | Status: AC
  Filled 2023-05-27: qty 2

## 2023-05-27 MED ORDER — MIDAZOLAM HCL 2 MG/2ML IJ SOLN
INTRAMUSCULAR | Status: DC | PRN
  Administered 2023-05-27: 1 mg via INTRAVENOUS

## 2023-05-27 MED ORDER — SODIUM CHLORIDE 0.9% FLUSH: 3.0000 mL | INTRAVENOUS | Status: DC | PRN

## 2023-05-27 SURGICAL SUPPLY — 10 items
CATH 5FR JL3.5 JR4 ANG PIG MP (CATHETERS) IMPLANT
CATH SWAN GANZ 7F STRAIGHT (CATHETERS) IMPLANT
DEVICE RAD COMP TR BAND LRG (VASCULAR PRODUCTS) IMPLANT
GLIDESHEATH SLEND A-KIT 6F 22G (SHEATH) IMPLANT
GLIDESHEATH SLENDER 7FR .021G (SHEATH) IMPLANT
GUIDEWIRE .025 260CM (WIRE) IMPLANT
GUIDEWIRE INQWIRE 1.5J.035X260 (WIRE) IMPLANT
INQWIRE 1.5J .035X260CM (WIRE) ×1 IMPLANT
PACK CARDIAC CATHETERIZATION (CUSTOM PROCEDURE TRAY) ×1 IMPLANT
SET ATX-X65L (MISCELLANEOUS) IMPLANT

## 2023-05-27 NOTE — Progress Notes (Signed)
 Order for BMP to be completed in 2 weeks has been placed and released.

## 2023-05-27 NOTE — Interval H&P Note (Signed)
 History and Physical Interval Note:  05/27/2023 9:30 AM  Keith Jones  has presented today for surgery, with the diagnosis of abnormal scan.  The various methods of treatment have been discussed with the patient and family. After consideration of risks, benefits and other options for treatment, the patient has consented to  Procedure(s): RIGHT/LEFT HEART CATH AND CORONARY ANGIOGRAPHY (N/A) as a surgical intervention.  The patient's history has been reviewed, patient examined, no change in status, stable for surgery.  I have reviewed the patient's chart and labs.  Questions were answered to the patient's satisfaction.     Churchill Grimsley J Brinly Maietta

## 2023-05-27 NOTE — H&P (Signed)
 OV 05/19/2023 copied for documentation   Cardiology Office Note:  .   Date:  05/27/2023  ID:  Keith Jones, DOB 02/05/1951, MRN [id number] PCP: Darrow Bussing, MD  [facility] Providers Cardiologist:  Truett Mainland, MD PCP: Darrow Bussing, MD  C/C: HFrEF     History of Present Illness: .    Keith Jones is a 73 y.o. male with hypertension, CAD, s/p PPM for high grade AV block, NSVT  Patient presented to [facility] in 06/[facility]21 with exertional dyspnea and fatigue symptoms and was found to have high-grade AV block.  Concurrently, he also had severe proximal/mid RCA stenosis.  With concern for ischemic AV block, he underwent overlapping stents in proximal/mid RCA.  However, he did not have adequate improvement in his conduction, therefore requiring to undergo pacemaker placement.  EF at that time was 40-45%.  Subsequently, patient has not had any symptoms of exertional dyspnea, but continued to show recurrent episodes of ventricular tachycardia.  He was recommended metoprolol, but he stopped taking due to side effects of fatigue.  Given persistent episodes of VT, albeit without symptoms, intermittent echocardiogram and stress testing.  He is here today with his wife to discuss results, see below.  Patient walks a mile and a half regularly without having any symptoms of exertional dyspnea, also denies any orthopnea, PND etc.   Vitals:   05/27/23 0908  BP: 139/78  Pulse: 62  Resp: (!) 21  Temp: 98.5 F (36.9 C)  SpO2: 97%      ROS:  Review of Systems  Cardiovascular:  Negative for chest pain, dyspnea on exertion, leg swelling, palpitations and syncope.     Studies Reviewed: Marland Kitchen        EKG 05/19/2023: Atrial-sensed ventricular-paced rhythm with prolonged AV conduction When compared with ECG of 18-Apr-2023 15:48, Vent. rate has increased BY  11 BPM     Independently interpreted 12/2022: Chol 134, TG 70, HDL 60, LDL 60 HbA1C 5.3% Hb  15.3  Echocardiogram 05/12/2023: 1. Left ventricular ejection fraction, by estimation, is 30 to 35%. The  left ventricle has moderately decreased function. The left ventricle has  no regional wall motion abnormalities. The left ventricular internal  cavity size was moderately dilated.  Left ventricular diastolic parameters are consistent with Grade I  diastolic dysfunction (impaired relaxation).   2. Right ventricular systolic function is normal. The right ventricular  size is normal.   3. Left atrial size was mildly dilated.   4. The mitral valve is normal in structure. Mild to moderate mitral valve  regurgitation. No evidence of mitral stenosis.   5. The aortic valve is tricuspid. There is mild calcification of the  aortic valve. Aortic valve regurgitation is mild. No aortic stenosis is  present.   6. The inferior vena cava is normal in size with greater than 50%  respiratory variability, suggesting right atrial pressure of 3 mmHg.   Conclusion(s)/Recommendation(s): There are frequent PVcs during this  study. The LV is dilated with septal dyssynchony. EF 30-35%.   Stress test 05/12/2023:   Findings are consistent with infarction. The study is high risk.   No ST deviation was noted.   LV perfusion is abnormal. There is no evidence of ischemia. There is evidence of infarction. Defect 1: There is a medium defect with moderate reduction in uptake present in the apical to mid inferior and inferoseptal location(s) that is fixed. There is abnormal wall motion in the defect area. Consistent with infarction.   Left  ventricular function is abnormal. Global function is moderately reduced. Nuclear stress EF: 33%. The left ventricular ejection fraction is moderately decreased (30-44%). End diastolic cavity size is moderately enlarged. End systolic cavity size is moderately enlarged.   Study suggestive of prior inferior infarct.  Additional perfusion defects on supine stress resolves with upright  imaging.  High risk study due to LVEF.   Remote pacer check 03/20/2023: Scheduled remote reviewed. Normal device function.   There was one NSVT that was greater than 20 beats that occurred on 03/17/2023, episode #9, sent to triage  Next remote 91 days.   Physical Exam:   Physical Exam Vitals and nursing note reviewed.  Constitutional:      General: He is not in acute distress. Neck:     Vascular: No JVD.  Cardiovascular:     Rate and Rhythm: Normal rate and regular rhythm.     Heart sounds: Normal heart sounds. No murmur heard. Pulmonary:     Effort: Pulmonary effort is normal.     Breath sounds: Normal breath sounds. No wheezing or rales.  Musculoskeletal:     Right lower leg: No edema.     Left lower leg: No edema.      VISIT DIAGNOSES: No diagnosis found.     ASSESSMENT AND PLAN: .    Keith Jones is a 73 y.o. male with hypertension, CAD, s/p PPM for high grade AV block, NSVT, HFrEF  HFrEF: EF 40-45% in 06/2021 at the time of presentation with symptomatic high-grade AV block. Recent NSVT episodes with echocardiogram 05/2023 showing EF of 30-35% with dilated LV, without regional wall motion abnormalities. Stress test showed fixed defect in apical and mid inferior inferoseptal myocardium suggestive of prior infarct, but no ischemia. It is perplexing that while he has infarct in RCA territory, his wall motion abnormality is global, with dilated LV. It is important to find out if etiology for his cardiomyopathy is ischemic or not. I recommend right and left heart catheterization to assess patency of his right coronary artery stents.  If stents are occluded, I do not think he will necessarily benefit from revascularization given infarct and no ischemia noted on stress testing.  However, if stents are open, he would mean that infarct occurred at some point possibly even before revascularization, but may point to alternate cause for his cardiomyopathy, including arrhythmia  induced cardiomyopathy caused by NSVT. I have reviewed the risks, indications, and alternatives to cardiac catheterization, possible angioplasty, and stenting with the patient. Risks include but are not limited to bleeding, infection, vascular injury, stroke, myocardial infection, arrhythmia, kidney injury, radiation-related injury in the case of prolonged fluoroscopy use, emergency cardiac surgery, and death. The patient understands the risks of serious complication is 1-2 in 1000 with diagnostic cardiac cath and 1-2% or less with angioplasty/stenting.   In the meantime, I will start him on low-dose bisoprolol 2.5 mg daily. After heart catheterization, I proposed to stop amlodipine 10 mg, and instead start losartan 50 mg daily. In absence of symptoms of HFrEF, he may not need Entresto at this time. In future, we could consider adding SGLT2i, and MRA, as well as uptitrate his medical therapy.  CAD: PCI to RCA in 06/2021. No angina symptoms. S/p RCA PCI for unstable angina, suspected ischemic heart block (06/2021) Continue Aspirin 81 mg daily. Continue Crestor 40 mg daily.  Lipids well controlled.   Complete AV block: Now /sp PPM   Hypertension: Well controlled   Informed Consent   Shared Decision Making/Informed  Consent The risks [stroke (1 in 1000), death (1 in 1000), kidney failure [usually temporary] (1 in 500), bleeding (1 in 200), allergic reaction [possibly serious] (1 in 200)], benefits (diagnostic support and management of coronary artery disease) and alternatives of a cardiac catheterization were discussed in detail with Keith Jones and he is willing to proceed.       Meds ordered this encounter  Medications   aspirin chewable tablet 81 mg   0.9 %  sodium chloride infusion     I spent 40 minutes in the care of Keith Jones today including reviewing invasive and noninvasive tests, including echocardiogram 06/2021, heart catheterization 06/2021, echocardiogram 05/2023, stress  test 05/2023, pacemaker findings of NSVT, discussing diagnosis of cardiomyopathy, further diagnostic and treatment options, face-to-face discussion and coordinating care, as well as documenting the encounter.   F/u after cath  Signed, [name], MD

## 2023-05-31 ENCOUNTER — Telehealth: Payer: Self-pay | Admitting: Cardiology

## 2023-05-31 NOTE — Telephone Encounter (Signed)
   Pre-operative Risk Assessment    Patient Name: Keith Jones  DOB: 23-Feb-1951 MRN: [id number]     Request for Surgical Clearance    Procedure:   initial visit/cleaning    Date of Surgery:  Clearance 05/31/23                                 Surgeon:  Maryan Rued Surgeon's Group or Practice Name:  [facility] Phone number:  [phone] Fax number:  [phone]   Type of Clearance Requested:   - Medical    Type of Anesthesia:   None   Additional requests/questions:  Does this patient need antibiotics?  Signed, April L Harrington   05/31/2023, 2:29 PM

## 2023-05-31 NOTE — Telephone Encounter (Signed)
 Informed scheduler to call DOD since Dentist is on the phone and patient in the chair.

## 2023-05-31 NOTE — Telephone Encounter (Signed)
 Spoke with dental office, Traci. Patient will not require SBE prophylaxis for dental cleaning per Dr Nelly Laurence (aware of PPM MDT).

## 2023-06-16 ENCOUNTER — Other Ambulatory Visit (HOSPITAL_COMMUNITY): Payer: Self-pay

## 2023-06-16 DIAGNOSIS — I442 Atrioventricular block, complete: Secondary | ICD-10-CM | POA: Diagnosis not present

## 2023-06-16 DIAGNOSIS — I2510 Atherosclerotic heart disease of native coronary artery without angina pectoris: Secondary | ICD-10-CM | POA: Diagnosis not present

## 2023-06-16 DIAGNOSIS — I5020 Unspecified systolic (congestive) heart failure: Secondary | ICD-10-CM | POA: Diagnosis not present

## 2023-06-16 DIAGNOSIS — E7800 Pure hypercholesterolemia, unspecified: Secondary | ICD-10-CM | POA: Diagnosis not present

## 2023-06-16 DIAGNOSIS — Z950 Presence of cardiac pacemaker: Secondary | ICD-10-CM | POA: Diagnosis not present

## 2023-06-16 DIAGNOSIS — I10 Essential (primary) hypertension: Secondary | ICD-10-CM | POA: Diagnosis not present

## 2023-06-16 DIAGNOSIS — Z79899 Other long term (current) drug therapy: Secondary | ICD-10-CM | POA: Diagnosis not present

## 2023-06-16 MED ORDER — ESZOPICLONE 3 MG PO TABS
3.0000 mg | ORAL_TABLET | Freq: Every evening | ORAL | 1 refills | Status: AC | PRN
  Filled 2023-06-16: qty 30, 30d supply, fill #0
  Filled 2023-08-11: qty 30, 30d supply, fill #1

## 2023-06-20 ENCOUNTER — Ambulatory Visit: Payer: Medicare HMO

## 2023-06-20 DIAGNOSIS — R001 Bradycardia, unspecified: Secondary | ICD-10-CM

## 2023-06-22 LAB — CUP PACEART REMOTE DEVICE CHECK
Battery Remaining Longevity: 119 mo
Battery Voltage: 3.02 V
Brady Statistic AP VP Percent: 16.91 %
Brady Statistic AP VS Percent: 0.06 %
Brady Statistic AS VP Percent: 76.73 %
Brady Statistic AS VS Percent: 6.29 %
Brady Statistic RA Percent Paced: 19.08 %
Brady Statistic RV Percent Paced: 93.64 %
Date Time Interrogation Session: 20250413220804
Implantable Lead Connection Status: 753985
Implantable Lead Connection Status: 753985
Implantable Lead Implant Date: 20230411
Implantable Lead Implant Date: 20230411
Implantable Lead Location: 753859
Implantable Lead Location: 753860
Implantable Lead Model: 3830
Implantable Lead Model: 5076
Implantable Pulse Generator Implant Date: 20230411
Lead Channel Impedance Value: 323 Ohm
Lead Channel Impedance Value: 380 Ohm
Lead Channel Impedance Value: 494 Ohm
Lead Channel Impedance Value: 551 Ohm
Lead Channel Pacing Threshold Amplitude: 0.5 V
Lead Channel Pacing Threshold Amplitude: 1 V
Lead Channel Pacing Threshold Pulse Width: 0.4 ms
Lead Channel Pacing Threshold Pulse Width: 0.4 ms
Lead Channel Sensing Intrinsic Amplitude: 2.625 mV
Lead Channel Sensing Intrinsic Amplitude: 2.625 mV
Lead Channel Sensing Intrinsic Amplitude: 31.625 mV
Lead Channel Sensing Intrinsic Amplitude: 31.625 mV
Lead Channel Setting Pacing Amplitude: 1.5 V
Lead Channel Setting Pacing Amplitude: 2.5 V
Lead Channel Setting Pacing Pulse Width: 0.4 ms
Lead Channel Setting Sensing Sensitivity: 1.2 mV
Zone Setting Status: 755011
Zone Setting Status: 755011

## 2023-06-24 ENCOUNTER — Encounter: Payer: Self-pay | Admitting: Internal Medicine

## 2023-07-18 ENCOUNTER — Ambulatory Visit: Payer: Medicare HMO | Attending: Cardiology | Admitting: Cardiology

## 2023-07-18 ENCOUNTER — Encounter: Payer: Self-pay | Admitting: Cardiology

## 2023-07-18 VITALS — BP 126/78 | HR 64 | Resp 16 | Ht 71.0 in | Wt 187.0 lb

## 2023-07-18 DIAGNOSIS — I25118 Atherosclerotic heart disease of native coronary artery with other forms of angina pectoris: Secondary | ICD-10-CM | POA: Diagnosis not present

## 2023-07-18 DIAGNOSIS — I428 Other cardiomyopathies: Secondary | ICD-10-CM

## 2023-07-18 NOTE — Progress Notes (Signed)
 Cardiology Office Note:  .   Date:  07/18/2023  ID:  Keith Jones, DOB 11-Feb-1951, MRN [id number] PCP: Lanae Pinal, MD  [facility] Providers Cardiologist:  Fransico Ivy, MD PCP: Lanae Pinal, MD   Chief Complaint  Patient presents with   Coronary artery disease involving native coronary artery of   Follow-up      History of Present Illness: .    Keith Jones is a 73 y.o. male with hypertension, CAD, s/p PPM for high grade AV block, NSVT, cardiomyopathy  Patient is doing well.  He notices cough occasionally, but is not a persistent feature.  He takes a nap during the daytime, but denies snoring.  He stays active during the day, walks regularly without symptoms of chest pain or shortness of breath.  Recent pacemaker check did not reveal any NSVT.  Vitals:   07/18/23 1301  BP: 126/78  Pulse: 64  Resp: 16  SpO2: 96%       ROS:  Review of Systems  Cardiovascular:  Negative for chest pain, dyspnea on exertion, leg swelling, palpitations and syncope.     Studies Reviewed: Aaron Aas        EKG 05/19/2023: Atrial-sensed ventricular-paced rhythm with prolonged AV conduction When compared with ECG of 18-Apr-2023 15:48, Vent. rate has increased BY  11 BPM    Coronary angiography 05/27/2023: LM: Normal LAD: No significant disease Lcx: Mid 50% disease (20% in 2023) RCA: Large, dominant vessel          Patent prox-mid overlapping stents with no significant restenosis   LVEDP 11 mmHg   Right heart catheterization 05/27/2023: RA: 10 mmHg RV: 29/6 mmHg PA: 24/12 mmHg, mPAP 17 mmHg PCW: 13 mmHg   AO sats: 93% PA sats: 75%   CO: 7.0 L/min CI: 3.4 L/min/m2   Nonobstructive coronary artery disease Dilated LV, but scar in RCA territory.  Cardiomyopathy could be combination of ischemic/nonischemic.  He is very well compensated with normal filling pressures and cardiac index. Given his class I symptoms, now starting on ARNI just as yet. Reduce amlodipine  from  10 mg to 5 mg daily, and start losartan  25 mg daily.  Check BMP in 1-2 weeks.  In future, could wean him off amlodipine  and uptitrate losartan  and/or add SGLT2 inhibitor.    Labs 05/2023: Hb 14.3 Cr 1.29, eGFR 59, K 5.0   12/2022: Chol 134, TG 70, HDL 60, LDL 60 HbA1C 5.3% Hb 15.3  Echocardiogram 05/12/2023: 1. Left ventricular ejection fraction, by estimation, is 30 to 35%. The  left ventricle has moderately decreased function. The left ventricle has  no regional wall motion abnormalities. The left ventricular internal  cavity size was moderately dilated.  Left ventricular diastolic parameters are consistent with Grade I  diastolic dysfunction (impaired relaxation).   2. Right ventricular systolic function is normal. The right ventricular  size is normal.   3. Left atrial size was mildly dilated.   4. The mitral valve is normal in structure. Mild to moderate mitral valve  regurgitation. No evidence of mitral stenosis.   5. The aortic valve is tricuspid. There is mild calcification of the  aortic valve. Aortic valve regurgitation is mild. No aortic stenosis is  present.   6. The inferior vena cava is normal in size with greater than 50%  respiratory variability, suggesting right atrial pressure of 3 mmHg.   Conclusion(s)/Recommendation(s): There are frequent PVcs during this  study. The LV is dilated with septal dyssynchony. EF 30-35%.   Stress  test 05/12/2023:   Findings are consistent with infarction. The study is high risk.   No ST deviation was noted.   LV perfusion is abnormal. There is no evidence of ischemia. There is evidence of infarction. Defect 1: There is a medium defect with moderate reduction in uptake present in the apical to mid inferior and inferoseptal location(s) that is fixed. There is abnormal wall motion in the defect area. Consistent with infarction.   Left ventricular function is abnormal. Global function is moderately reduced. Nuclear stress EF: 33%. The left  ventricular ejection fraction is moderately decreased (30-44%). End diastolic cavity size is moderately enlarged. End systolic cavity size is moderately enlarged.   Study suggestive of prior inferior infarct.  Additional perfusion defects on supine stress resolves with upright imaging.  High risk study due to LVEF.   Remote pacer check 03/20/2023: Scheduled remote reviewed. Normal device function.   There was one NSVT that was greater than 20 beats that occurred on 03/17/2023, episode #9, sent to triage  Next remote 91 days.   Physical Exam:   Physical Exam Vitals and nursing note reviewed.  Constitutional:      General: He is not in acute distress. Neck:     Vascular: No JVD.  Cardiovascular:     Rate and Rhythm: Normal rate and regular rhythm.     Heart sounds: Normal heart sounds. No murmur heard. Pulmonary:     Effort: Pulmonary effort is normal.     Breath sounds: Normal breath sounds. No wheezing or rales.  Musculoskeletal:     Right lower leg: No edema.     Left lower leg: No edema.      VISIT DIAGNOSES:   ICD-10-CM   1. Coronary artery disease of native artery of native heart with stable angina pectoris (HCC)  I25.118 Basic metabolic panel with GFR    2. Nonischemic cardiomyopathy (HCC)  I42.8 ECHOCARDIOGRAM COMPLETE         ASSESSMENT AND PLAN: .    Keith Jones is a 73 y.o. male with hypertension, CAD, s/p PPM for high grade AV block, NSVT, cardiomyopathy  HFrEF: EF 40-45% in 06/2021 at the time of presentation with symptomatic high-grade AV block. NSVT episodes with echocardiogram 05/2023 showing EF of 30-35% with dilated LV, without regional wall motion abnormalities. Stress test showed fixed defect in apical and mid inferior inferoseptal myocardium suggestive of prior infarct, but no ischemia. Nonobstructive CAD with patent stents (Cath 06/2023). Currently on bisoprolol  2.5 mg daily, losartan  25 mg daily. He is euvolemic with no heart failure symptoms at  this time.  There is no recurrence of NSVT. Continue current medications.  Will check echocardiogram in 08/2019.  If EF remains low, we will then consider adding SGLT2i, and MRA, as well as uptitrate his medical therapy. Check BMP today.  CAD: PCI to RCA in 06/2021, stents patnet in 06/2023. No angina symptoms. Continue Aspirin  81 mg daily, Crestor  40 mg daily.  Lipids well controlled.   Complete AV block: Now /sp PPM   Hypertension: Well controlled   F/u in 3 months   Signed, Cody Das, MD

## 2023-07-18 NOTE — Patient Instructions (Addendum)
 Lab Work: [employer]  If you have labs (blood work) drawn today and your tests are completely normal, you will receive your results only by: MyChart Message (if you have MyChart) OR A paper copy in the mail If you have any lab test that is abnormal or we need to change your treatment, we will call you to review the results.  Testing/Procedures: ECHO IN 08/2023  Your physician has requested that you have an echocardiogram. Echocardiography is a painless test that uses sound waves to create images of your heart. It provides your doctor with information about the size and shape of your heart and how well your heart's chambers and valves are working. This procedure takes approximately one hour. There are no restrictions for this procedure. Please do NOT wear cologne, perfume, aftershave, or lotions (deodorant is allowed). Please arrive 15 minutes prior to your appointment time.  Please note: We ask at that you not bring children with you during ultrasound (echo/ vascular) testing. Due to room size and safety concerns, children are not allowed in the ultrasound rooms during exams. Our front office staff cannot provide observation of children in our lobby area while testing is being conducted. An adult accompanying a patient to their appointment will only be allowed in the ultrasound room at the discretion of the ultrasound technician under special circumstances. We apologize for any inconvenience.   Follow-Up: At [facility], you and your health needs are our priority.  As part of our continuing mission to provide you with exceptional heart care, our providers are all part of one team.  This team includes your primary Cardiologist (physician) and Advanced Practice Providers or APPs (Physician Assistants and Nurse Practitioners) who all work together to provide you with the care you need, when you need it.  Your next appointment:   6 month(s)  Provider:   Cody Das, MD

## 2023-07-19 LAB — BASIC METABOLIC PANEL WITH GFR
BUN/Creatinine Ratio: 15 (ref 10–24)
BUN: 20 mg/dL (ref 8–27)
CO2: 22 mmol/L (ref 20–29)
Calcium: 9.2 mg/dL (ref 8.6–10.2)
Chloride: 95 mmol/L — ABNORMAL LOW (ref 96–106)
Creatinine, Ser: 1.34 mg/dL — ABNORMAL HIGH (ref 0.76–1.27)
Glucose: 87 mg/dL (ref 70–99)
Potassium: 4.5 mmol/L (ref 3.5–5.2)
Sodium: 132 mmol/L — ABNORMAL LOW (ref 134–144)
eGFR: 56 mL/min/{1.73_m2} — ABNORMAL LOW (ref >59)

## 2023-07-20 ENCOUNTER — Ambulatory Visit: Payer: Self-pay

## 2023-08-06 DIAGNOSIS — I10 Essential (primary) hypertension: Secondary | ICD-10-CM | POA: Diagnosis not present

## 2023-08-06 DIAGNOSIS — I2510 Atherosclerotic heart disease of native coronary artery without angina pectoris: Secondary | ICD-10-CM | POA: Diagnosis not present

## 2023-08-06 DIAGNOSIS — E7800 Pure hypercholesterolemia, unspecified: Secondary | ICD-10-CM | POA: Diagnosis not present

## 2023-08-11 ENCOUNTER — Other Ambulatory Visit (HOSPITAL_COMMUNITY): Payer: Self-pay

## 2023-08-11 NOTE — Progress Notes (Signed)
 Remote pacemaker transmission.

## 2023-08-11 NOTE — Addendum Note (Signed)
 Addended by: Edra Govern D on: 08/11/2023 02:02 PM   Modules accepted: Orders

## 2023-08-26 ENCOUNTER — Other Ambulatory Visit (HOSPITAL_COMMUNITY): Payer: Self-pay

## 2023-08-26 ENCOUNTER — Ambulatory Visit (HOSPITAL_COMMUNITY)
Admission: RE | Admit: 2023-08-26 | Discharge: 2023-08-26 | Disposition: A | Discharge status: Home / Self Care | Source: Ambulatory Visit | Attending: Cardiology | Admitting: Cardiology

## 2023-08-26 DIAGNOSIS — I428 Other cardiomyopathies: Secondary | ICD-10-CM

## 2023-08-26 LAB — ECHOCARDIOGRAM COMPLETE
Area-P 1/2: 2.16 cm2
Calc EF: 46.5 %
P 1/2 time: 529 ms
S' Lateral: 4.6 cm
Single Plane A2C EF: 38.5 %
Single Plane A4C EF: 48.4 %

## 2023-09-14 DIAGNOSIS — H02831 Dermatochalasis of right upper eyelid: Secondary | ICD-10-CM | POA: Diagnosis not present

## 2023-09-14 DIAGNOSIS — H02834 Dermatochalasis of left upper eyelid: Secondary | ICD-10-CM | POA: Diagnosis not present

## 2023-09-14 DIAGNOSIS — H40053 Ocular hypertension, bilateral: Secondary | ICD-10-CM | POA: Diagnosis not present

## 2023-09-14 DIAGNOSIS — H2513 Age-related nuclear cataract, bilateral: Secondary | ICD-10-CM | POA: Diagnosis not present

## 2023-09-19 ENCOUNTER — Ambulatory Visit: Payer: Medicare HMO

## 2023-09-19 DIAGNOSIS — I442 Atrioventricular block, complete: Secondary | ICD-10-CM

## 2023-09-20 LAB — CUP PACEART REMOTE DEVICE CHECK
Battery Remaining Longevity: 102 mo
Battery Voltage: 3.01 V
Brady Statistic AP VP Percent: 23.84 %
Brady Statistic AP VS Percent: 0.02 %
Brady Statistic AS VP Percent: 73.43 %
Brady Statistic AS VS Percent: 2.71 %
Brady Statistic RA Percent Paced: 24.88 %
Brady Statistic RV Percent Paced: 97.27 %
Date Time Interrogation Session: 20250714011011
Implantable Lead Connection Status: 753985
Implantable Lead Connection Status: 753985
Implantable Lead Implant Date: 20230411
Implantable Lead Implant Date: 20230411
Implantable Lead Location: 753859
Implantable Lead Location: 753860
Implantable Lead Model: 3830
Implantable Lead Model: 5076
Implantable Pulse Generator Implant Date: 20230411
Lead Channel Impedance Value: 361 Ohm
Lead Channel Impedance Value: 380 Ohm
Lead Channel Impedance Value: 399 Ohm
Lead Channel Impedance Value: 532 Ohm
Lead Channel Pacing Threshold Amplitude: 0.375 V
Lead Channel Pacing Threshold Amplitude: 1.5 V
Lead Channel Pacing Threshold Pulse Width: 0.4 ms
Lead Channel Pacing Threshold Pulse Width: 0.4 ms
Lead Channel Sensing Intrinsic Amplitude: 2.875 mV
Lead Channel Sensing Intrinsic Amplitude: 2.875 mV
Lead Channel Sensing Intrinsic Amplitude: 31.625 mV
Lead Channel Sensing Intrinsic Amplitude: 31.625 mV
Lead Channel Setting Pacing Amplitude: 1.5 V
Lead Channel Setting Pacing Amplitude: 3 V
Lead Channel Setting Pacing Pulse Width: 0.4 ms
Lead Channel Setting Sensing Sensitivity: 1.2 mV
Zone Setting Status: 755011
Zone Setting Status: 755011

## 2023-09-21 ENCOUNTER — Ambulatory Visit: Payer: Self-pay | Admitting: Internal Medicine

## 2023-10-13 DIAGNOSIS — D225 Melanocytic nevi of trunk: Secondary | ICD-10-CM | POA: Diagnosis not present

## 2023-10-13 DIAGNOSIS — L570 Actinic keratosis: Secondary | ICD-10-CM | POA: Diagnosis not present

## 2023-10-13 DIAGNOSIS — L821 Other seborrheic keratosis: Secondary | ICD-10-CM | POA: Diagnosis not present

## 2023-10-13 DIAGNOSIS — L578 Other skin changes due to chronic exposure to nonionizing radiation: Secondary | ICD-10-CM | POA: Diagnosis not present

## 2023-10-13 DIAGNOSIS — L814 Other melanin hyperpigmentation: Secondary | ICD-10-CM | POA: Diagnosis not present

## 2023-11-06 DIAGNOSIS — I10 Essential (primary) hypertension: Secondary | ICD-10-CM | POA: Diagnosis not present

## 2023-11-06 DIAGNOSIS — I2510 Atherosclerotic heart disease of native coronary artery without angina pectoris: Secondary | ICD-10-CM | POA: Diagnosis not present

## 2023-11-06 DIAGNOSIS — E7800 Pure hypercholesterolemia, unspecified: Secondary | ICD-10-CM | POA: Diagnosis not present

## 2023-11-08 ENCOUNTER — Other Ambulatory Visit: Payer: Self-pay | Admitting: Cardiology

## 2023-11-08 ENCOUNTER — Other Ambulatory Visit (HOSPITAL_COMMUNITY): Payer: Self-pay

## 2023-11-08 DIAGNOSIS — I2510 Atherosclerotic heart disease of native coronary artery without angina pectoris: Secondary | ICD-10-CM

## 2023-11-08 DIAGNOSIS — I4729 Other ventricular tachycardia: Secondary | ICD-10-CM

## 2023-11-08 DIAGNOSIS — I5020 Unspecified systolic (congestive) heart failure: Secondary | ICD-10-CM

## 2023-11-09 ENCOUNTER — Other Ambulatory Visit (HOSPITAL_COMMUNITY): Payer: Self-pay

## 2023-11-09 MED ORDER — BISOPROLOL FUMARATE 5 MG PO TABS
2.5000 mg | ORAL_TABLET | Freq: Every day | ORAL | 2 refills | Status: AC
  Filled 2023-11-09: qty 45, 90d supply, fill #0
  Filled 2024-02-06: qty 45, 90d supply, fill #1

## 2023-11-11 DIAGNOSIS — M65341 Trigger finger, right ring finger: Secondary | ICD-10-CM | POA: Diagnosis not present

## 2023-12-06 DIAGNOSIS — I2510 Atherosclerotic heart disease of native coronary artery without angina pectoris: Secondary | ICD-10-CM | POA: Diagnosis not present

## 2023-12-06 DIAGNOSIS — I10 Essential (primary) hypertension: Secondary | ICD-10-CM | POA: Diagnosis not present

## 2023-12-06 DIAGNOSIS — E7800 Pure hypercholesterolemia, unspecified: Secondary | ICD-10-CM | POA: Diagnosis not present

## 2023-12-15 NOTE — Progress Notes (Signed)
 Remote PPM Transmission

## 2023-12-16 ENCOUNTER — Other Ambulatory Visit (HOSPITAL_COMMUNITY): Payer: Self-pay

## 2023-12-19 ENCOUNTER — Ambulatory Visit: Payer: Medicare HMO

## 2023-12-19 DIAGNOSIS — I2510 Atherosclerotic heart disease of native coronary artery without angina pectoris: Secondary | ICD-10-CM

## 2023-12-19 LAB — CUP PACEART REMOTE DEVICE CHECK
Battery Remaining Longevity: 103 mo
Battery Voltage: 3.01 V
Brady Statistic AP VP Percent: 18.93 %
Brady Statistic AP VS Percent: 0 %
Brady Statistic AS VP Percent: 80.63 %
Brady Statistic AS VS Percent: 0.44 %
Brady Statistic RA Percent Paced: 19.11 %
Brady Statistic RV Percent Paced: 99.55 %
Date Time Interrogation Session: 20251013005505
Implantable Lead Connection Status: 753985
Implantable Lead Connection Status: 753985
Implantable Lead Implant Date: 20230411
Implantable Lead Implant Date: 20230411
Implantable Lead Location: 753859
Implantable Lead Location: 753860
Implantable Lead Model: 3830
Implantable Lead Model: 5076
Implantable Pulse Generator Implant Date: 20230411
Lead Channel Impedance Value: 361 Ohm
Lead Channel Impedance Value: 361 Ohm
Lead Channel Impedance Value: 418 Ohm
Lead Channel Impedance Value: 513 Ohm
Lead Channel Pacing Threshold Amplitude: 0.375 V
Lead Channel Pacing Threshold Amplitude: 1.375 V
Lead Channel Pacing Threshold Pulse Width: 0.4 ms
Lead Channel Pacing Threshold Pulse Width: 0.4 ms
Lead Channel Sensing Intrinsic Amplitude: 2.875 mV
Lead Channel Sensing Intrinsic Amplitude: 2.875 mV
Lead Channel Sensing Intrinsic Amplitude: 31.625 mV
Lead Channel Sensing Intrinsic Amplitude: 31.625 mV
Lead Channel Setting Pacing Amplitude: 1.5 V
Lead Channel Setting Pacing Amplitude: 2.75 V
Lead Channel Setting Pacing Pulse Width: 0.4 ms
Lead Channel Setting Sensing Sensitivity: 1.2 mV
Zone Setting Status: 755011
Zone Setting Status: 755011

## 2023-12-21 NOTE — Progress Notes (Signed)
 Remote PPM Transmission

## 2023-12-22 ENCOUNTER — Ambulatory Visit: Payer: Self-pay | Admitting: Internal Medicine

## 2024-01-06 DIAGNOSIS — I2510 Atherosclerotic heart disease of native coronary artery without angina pectoris: Secondary | ICD-10-CM | POA: Diagnosis not present

## 2024-01-06 DIAGNOSIS — I10 Essential (primary) hypertension: Secondary | ICD-10-CM | POA: Diagnosis not present

## 2024-01-06 DIAGNOSIS — E7800 Pure hypercholesterolemia, unspecified: Secondary | ICD-10-CM | POA: Diagnosis not present

## 2024-01-08 IMAGING — DX DG CHEST 1V PORT
1 series · 2 of 2 positions shown · non-contrast
Comparison: Included portion from coronary CT 12/24/2020

CLINICAL DATA: Shortness of breath. Dizziness. Second degree heart
block EKG.

EXAM:
PORTABLE CHEST 1 VIEW

[Series 1: chest · 0.14mm/px · 2 of 2 slices shown]
[im 1/2]
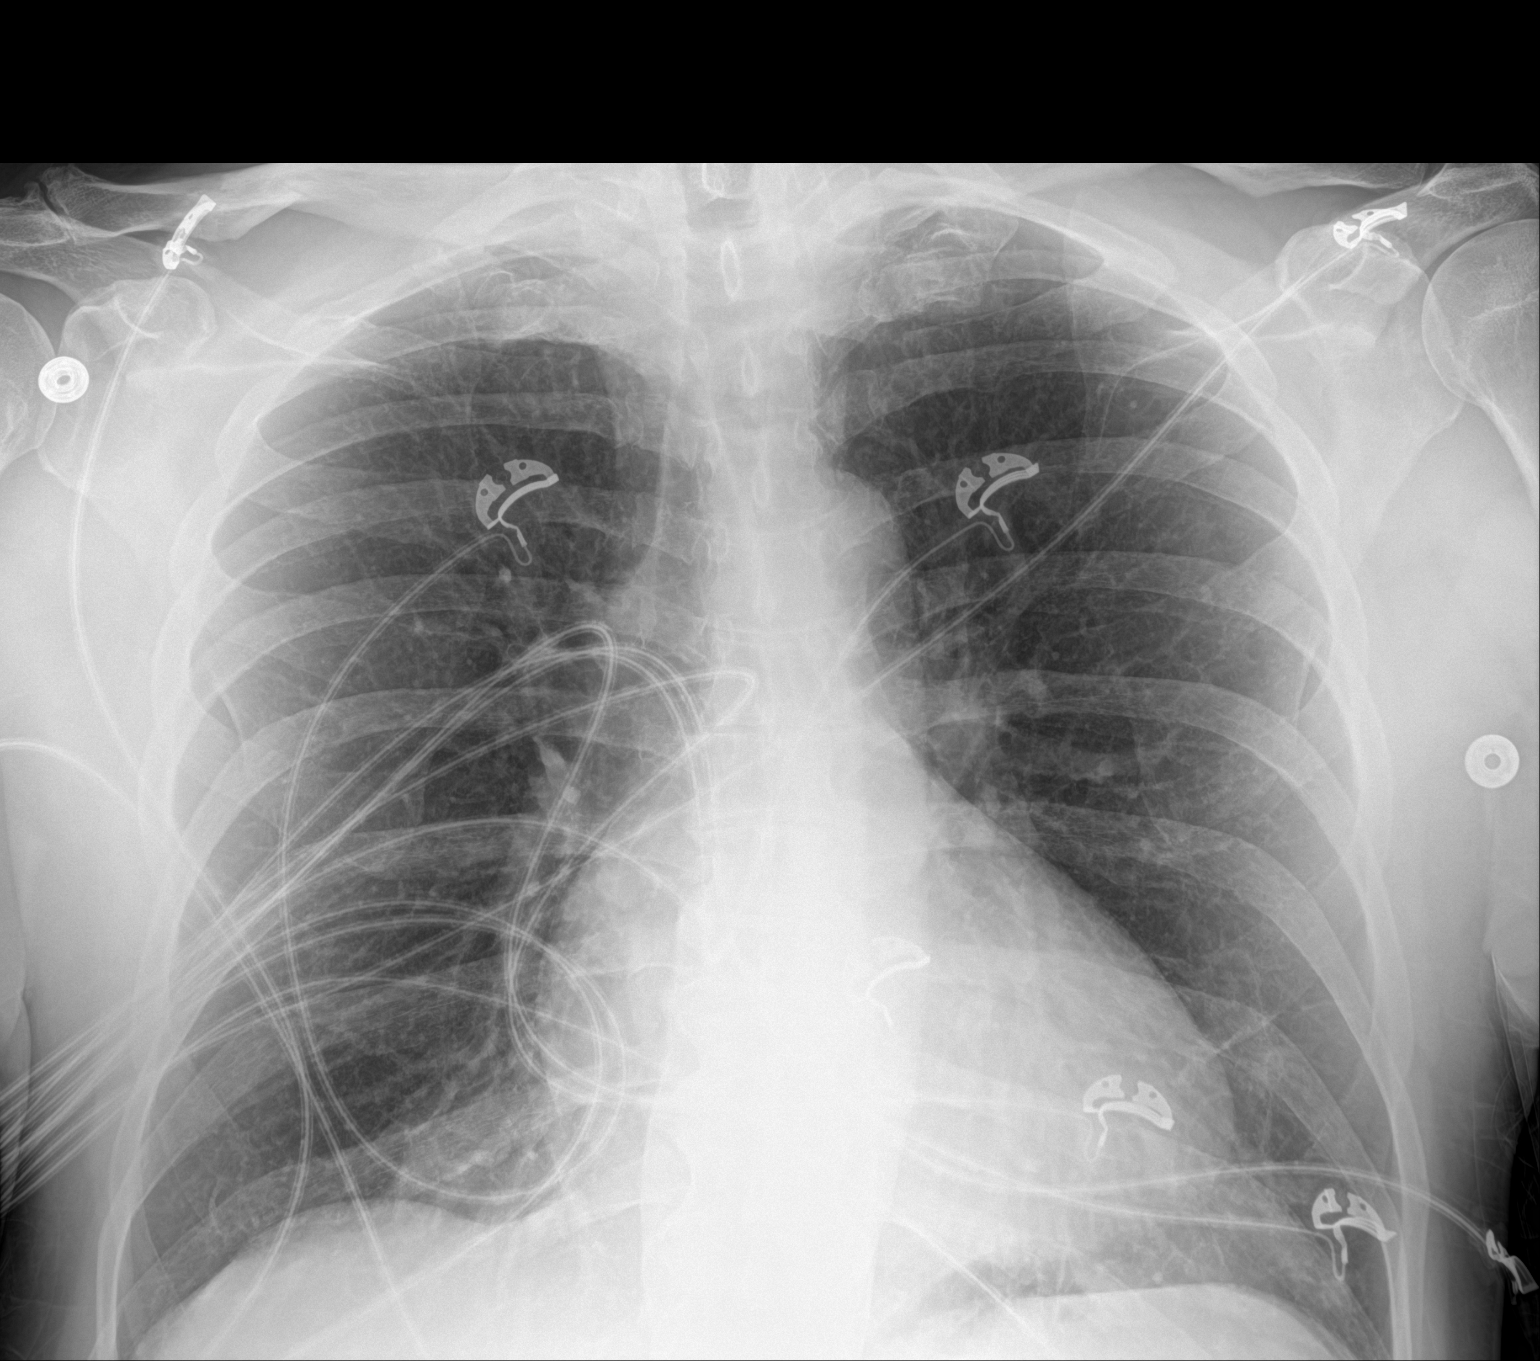
[im 2/2]
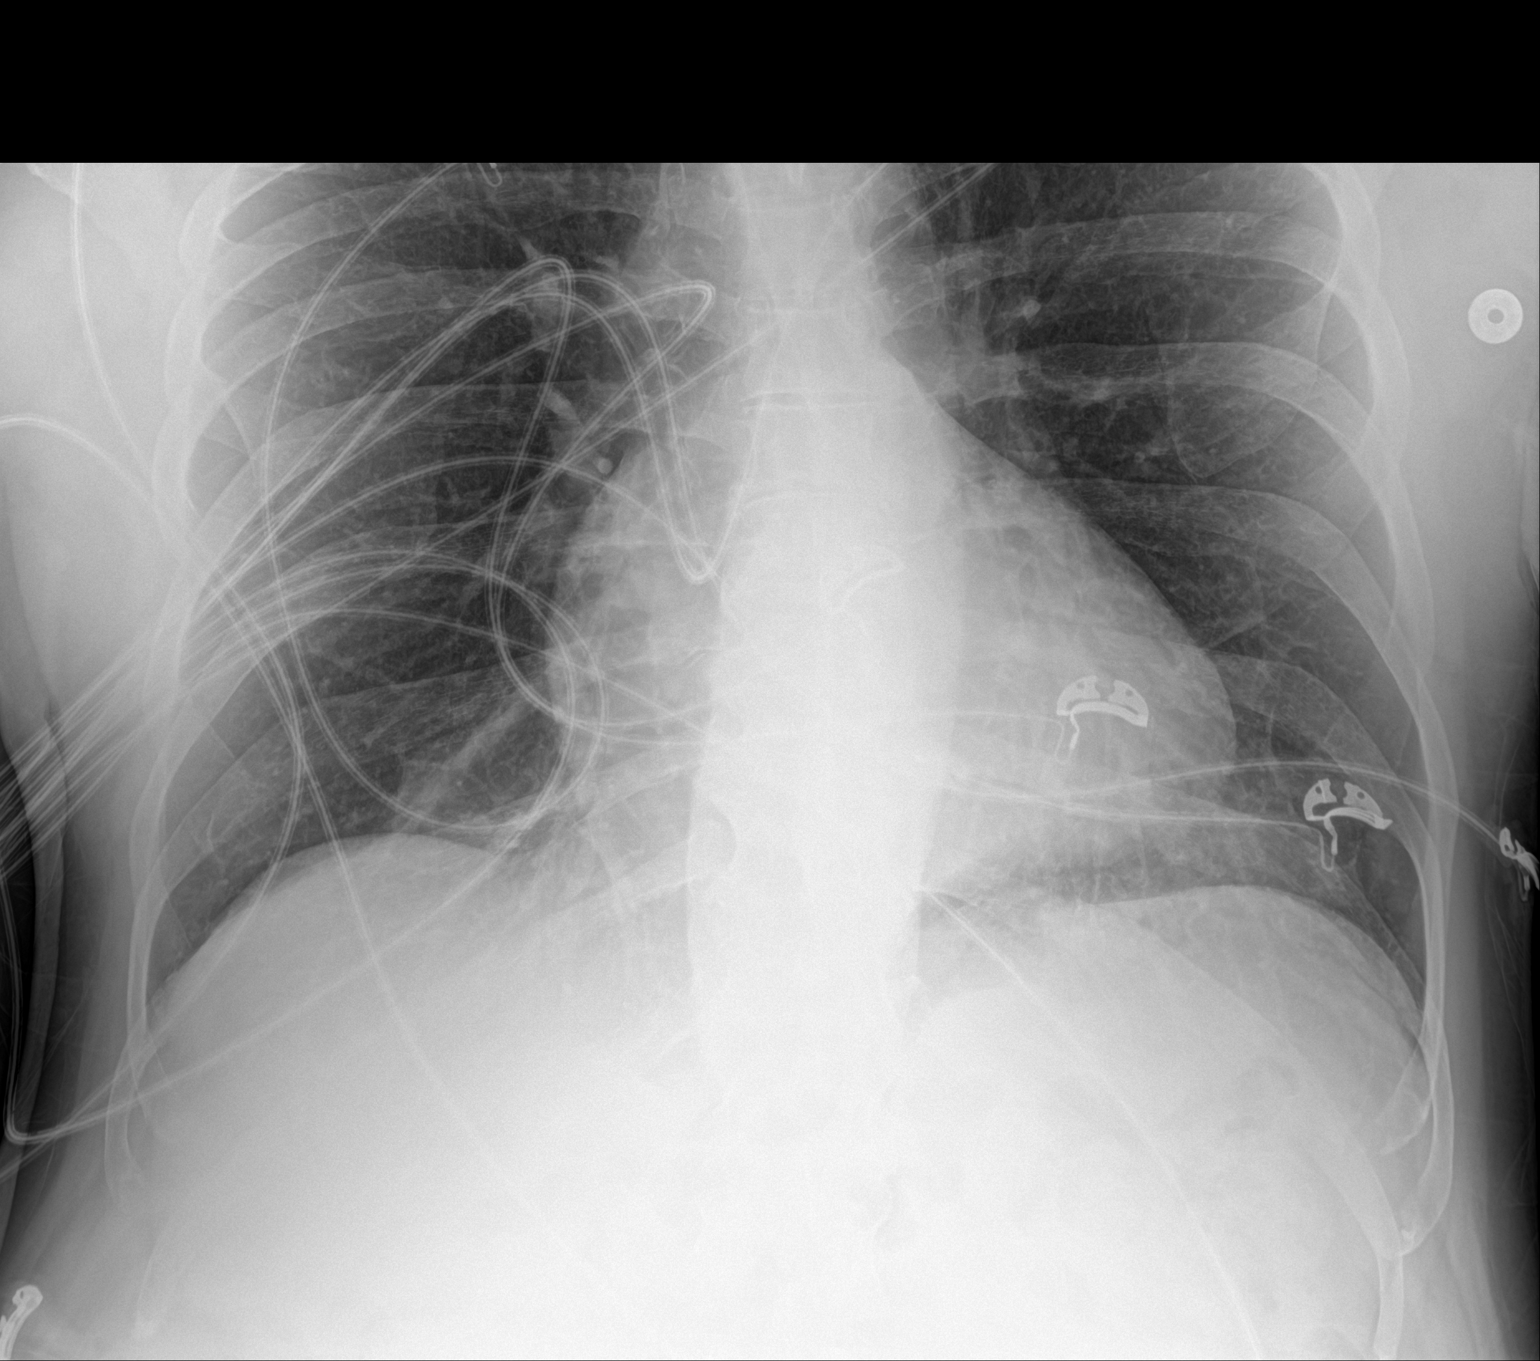

[2 of 2 positions shown; findings below may reference images not displayed]

FINDINGS: The heart is mildly enlarged. Normal mediastinal contours. Scattered
subsegmental atelectasis in the lung bases without confluent
airspace disease. No pulmonary edema, pleural effusion, or
pneumothorax. No acute osseous findings.
IMPRESSION: Mild cardiomegaly with scattered subsegmental atelectasis in the
lung bases.

## 2024-01-14 IMAGING — DX DG CHEST 2V
2 series · 2 of 2 positions shown · non-contrast
Comparison: 06/11/2021.

CLINICAL DATA: Post pacemaker.

EXAM:
CHEST - 2 VIEW

[chest pa]
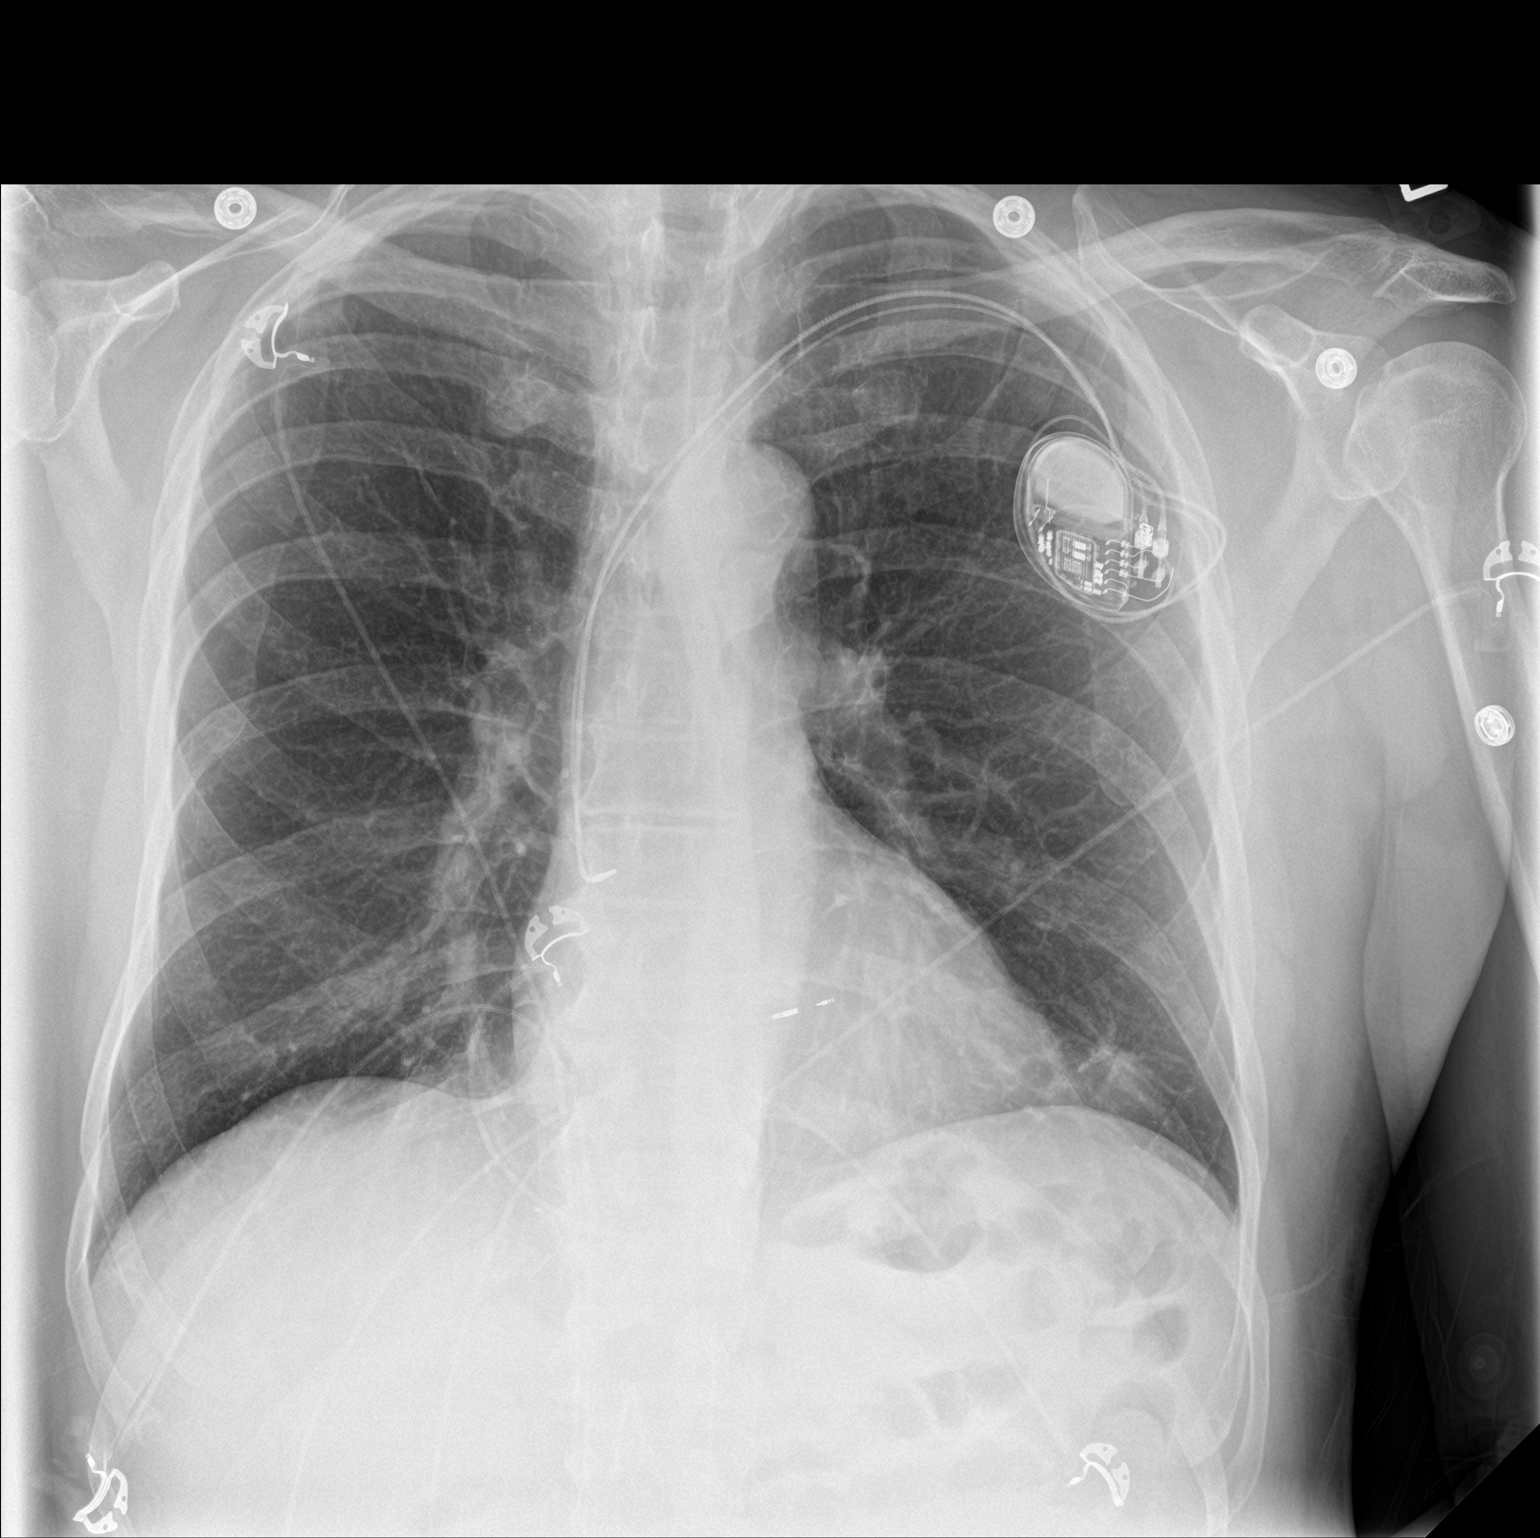

[chest lat]
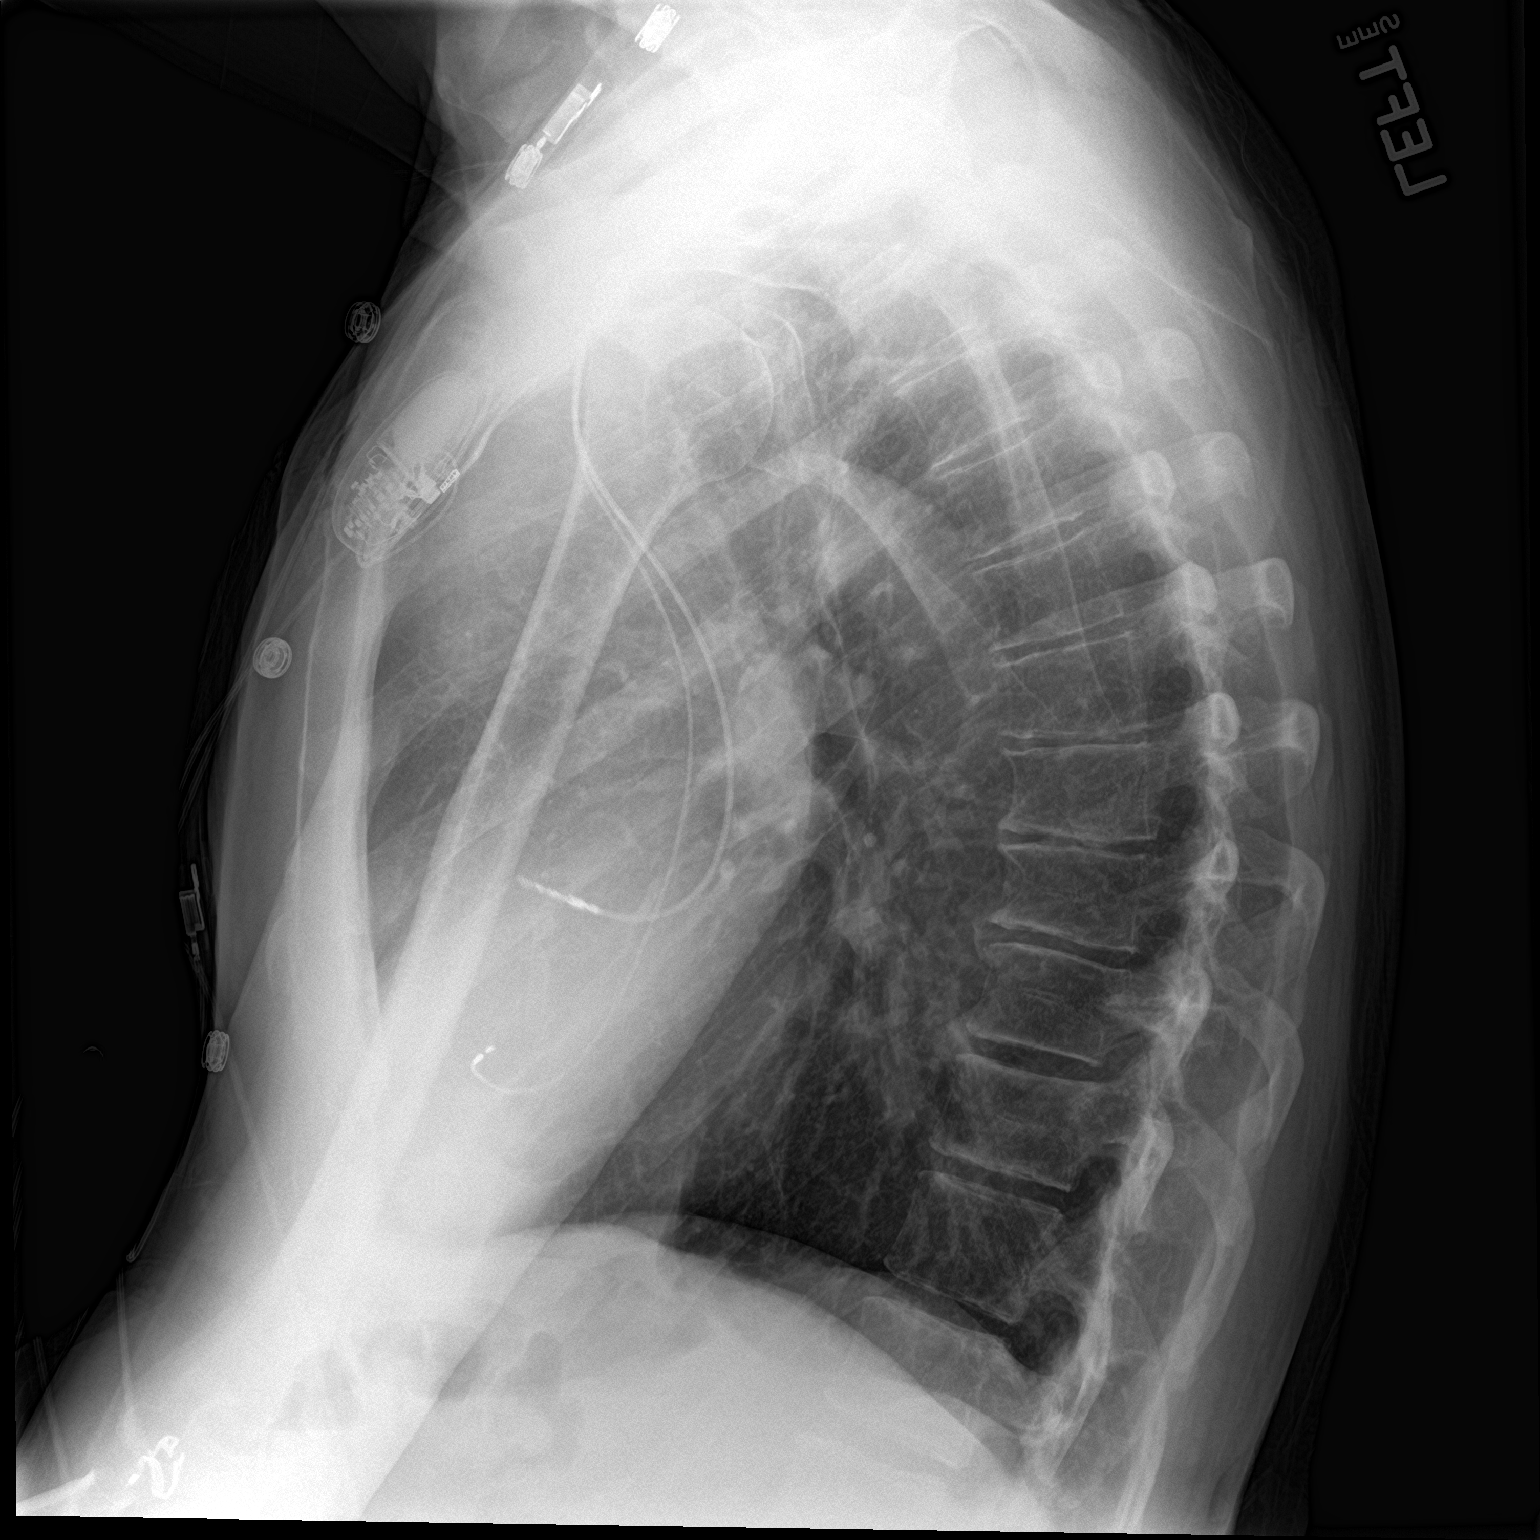

[2 of 2 positions shown; findings below may reference images not displayed]

FINDINGS: The heart size and mediastinal contours are within normal limits.
There is atherosclerotic calcification of the aorta. Mild
atelectasis is noted at the left lung base. No effusion or
pneumothorax. Dual lead pacemaker is present over the left chest. No
acute osseous abnormality is identified.
IMPRESSION: 1. Left dual lead pacemaker.  No pneumothorax.
2. Mild atelectasis at the left lung base.

## 2024-01-17 DIAGNOSIS — I5020 Unspecified systolic (congestive) heart failure: Secondary | ICD-10-CM | POA: Diagnosis not present

## 2024-01-17 DIAGNOSIS — I442 Atrioventricular block, complete: Secondary | ICD-10-CM | POA: Diagnosis not present

## 2024-01-17 DIAGNOSIS — I10 Essential (primary) hypertension: Secondary | ICD-10-CM | POA: Diagnosis not present

## 2024-01-17 DIAGNOSIS — Z79899 Other long term (current) drug therapy: Secondary | ICD-10-CM | POA: Diagnosis not present

## 2024-01-17 DIAGNOSIS — I2510 Atherosclerotic heart disease of native coronary artery without angina pectoris: Secondary | ICD-10-CM | POA: Diagnosis not present

## 2024-01-17 DIAGNOSIS — E7800 Pure hypercholesterolemia, unspecified: Secondary | ICD-10-CM | POA: Diagnosis not present

## 2024-01-17 DIAGNOSIS — Z1331 Encounter for screening for depression: Secondary | ICD-10-CM | POA: Diagnosis not present

## 2024-01-19 DIAGNOSIS — Z85828 Personal history of other malignant neoplasm of skin: Secondary | ICD-10-CM | POA: Diagnosis not present

## 2024-01-19 DIAGNOSIS — L578 Other skin changes due to chronic exposure to nonionizing radiation: Secondary | ICD-10-CM | POA: Diagnosis not present

## 2024-01-19 DIAGNOSIS — Z09 Encounter for follow-up examination after completed treatment for conditions other than malignant neoplasm: Secondary | ICD-10-CM | POA: Diagnosis not present

## 2024-01-19 DIAGNOSIS — Z08 Encounter for follow-up examination after completed treatment for malignant neoplasm: Secondary | ICD-10-CM | POA: Diagnosis not present

## 2024-01-19 DIAGNOSIS — L570 Actinic keratosis: Secondary | ICD-10-CM | POA: Diagnosis not present

## 2024-02-05 DIAGNOSIS — I2510 Atherosclerotic heart disease of native coronary artery without angina pectoris: Secondary | ICD-10-CM | POA: Diagnosis not present

## 2024-02-05 DIAGNOSIS — I10 Essential (primary) hypertension: Secondary | ICD-10-CM | POA: Diagnosis not present

## 2024-02-05 DIAGNOSIS — E7800 Pure hypercholesterolemia, unspecified: Secondary | ICD-10-CM | POA: Diagnosis not present

## 2024-02-06 DIAGNOSIS — R972 Elevated prostate specific antigen [PSA]: Secondary | ICD-10-CM | POA: Diagnosis not present

## 2024-02-13 DIAGNOSIS — R972 Elevated prostate specific antigen [PSA]: Secondary | ICD-10-CM | POA: Diagnosis not present

## 2024-02-13 DIAGNOSIS — N5201 Erectile dysfunction due to arterial insufficiency: Secondary | ICD-10-CM | POA: Diagnosis not present

## 2024-02-20 ENCOUNTER — Other Ambulatory Visit (HOSPITAL_COMMUNITY): Payer: Self-pay

## 2024-03-19 ENCOUNTER — Ambulatory Visit: Payer: Medicare HMO

## 2024-03-19 DIAGNOSIS — R001 Bradycardia, unspecified: Secondary | ICD-10-CM | POA: Diagnosis not present

## 2024-03-20 LAB — CUP PACEART REMOTE DEVICE CHECK
Battery Remaining Longevity: 97 mo
Battery Voltage: 3.01 V
Brady Statistic AP VP Percent: 15.38 %
Brady Statistic AP VS Percent: 0 %
Brady Statistic AS VP Percent: 84.1 %
Brady Statistic AS VS Percent: 0.52 %
Brady Statistic RA Percent Paced: 15.59 %
Brady Statistic RV Percent Paced: 99.48 %
Date Time Interrogation Session: 20260112033515
Implantable Lead Connection Status: 753985
Implantable Lead Connection Status: 753985
Implantable Lead Implant Date: 20230411
Implantable Lead Implant Date: 20230411
Implantable Lead Location: 753859
Implantable Lead Location: 753860
Implantable Lead Model: 3830
Implantable Lead Model: 5076
Implantable Pulse Generator Implant Date: 20230411
Lead Channel Impedance Value: 323 Ohm
Lead Channel Impedance Value: 361 Ohm
Lead Channel Impedance Value: 380 Ohm
Lead Channel Impedance Value: 475 Ohm
Lead Channel Pacing Threshold Amplitude: 0.375 V
Lead Channel Pacing Threshold Amplitude: 1.25 V
Lead Channel Pacing Threshold Pulse Width: 0.4 ms
Lead Channel Pacing Threshold Pulse Width: 0.4 ms
Lead Channel Sensing Intrinsic Amplitude: 2.75 mV
Lead Channel Sensing Intrinsic Amplitude: 2.75 mV
Lead Channel Sensing Intrinsic Amplitude: 31.625 mV
Lead Channel Sensing Intrinsic Amplitude: 31.625 mV
Lead Channel Setting Pacing Amplitude: 1.5 V
Lead Channel Setting Pacing Amplitude: 2.75 V
Lead Channel Setting Pacing Pulse Width: 0.4 ms
Lead Channel Setting Sensing Sensitivity: 1.2 mV
Zone Setting Status: 755011
Zone Setting Status: 755011

## 2024-03-21 ENCOUNTER — Ambulatory Visit: Payer: Self-pay | Admitting: Cardiology

## 2024-03-23 NOTE — Progress Notes (Signed)
 Remote PPM Transmission

## 2024-04-16 ENCOUNTER — Ambulatory Visit: Admitting: Pulmonary Disease

## 2024-06-18 ENCOUNTER — Ambulatory Visit

## 2024-09-17 ENCOUNTER — Ambulatory Visit

## 2024-12-17 ENCOUNTER — Ambulatory Visit

## 2025-03-18 ENCOUNTER — Ambulatory Visit
